# Patient Record
Sex: Male | Born: 1965 | Hispanic: Yes | Marital: Married | Smoking: Former smoker
Health system: Southern US, Community
[De-identification: ages and names within clinical notes are randomized; demographics above are authoritative.]

## PROBLEM LIST (undated history)

## (undated) DIAGNOSIS — K219 Gastro-esophageal reflux disease without esophagitis: Secondary | ICD-10-CM

## (undated) DIAGNOSIS — I10 Essential (primary) hypertension: Secondary | ICD-10-CM

## (undated) DIAGNOSIS — T7840XA Allergy, unspecified, initial encounter: Secondary | ICD-10-CM

## (undated) HISTORY — DX: Gastro-esophageal reflux disease without esophagitis: K21.9

## (undated) HISTORY — DX: Essential (primary) hypertension: I10

## (undated) HISTORY — PX: LEG SURGERY: SHX1003

## (undated) HISTORY — DX: Allergy, unspecified, initial encounter: T78.40XA

---

## 2008-10-23 ENCOUNTER — Encounter: Admission: RE | Admit: 2008-10-23 | Discharge: 2008-10-23 | Payer: Self-pay | Admitting: Family Medicine

## 2008-11-08 ENCOUNTER — Ambulatory Visit (HOSPITAL_BASED_OUTPATIENT_CLINIC_OR_DEPARTMENT_OTHER): Admission: RE | Admit: 2008-11-08 | Discharge: 2008-11-08 | Payer: Self-pay | Admitting: General Surgery

## 2008-11-08 HISTORY — PX: INGUINAL HERNIA REPAIR: SHX194

## 2009-11-22 ENCOUNTER — Ambulatory Visit (HOSPITAL_COMMUNITY): Admission: RE | Admit: 2009-11-22 | Discharge: 2009-11-22 | Payer: Self-pay | Admitting: Family Medicine

## 2010-11-19 LAB — POCT HEMOGLOBIN-HEMACUE: Hemoglobin: 17.2 g/dL — ABNORMAL HIGH (ref 13.0–17.0)

## 2010-11-20 LAB — BASIC METABOLIC PANEL
BUN: 10 mg/dL (ref 6–23)
CO2: 27 mEq/L (ref 19–32)
Calcium: 9.4 mg/dL (ref 8.4–10.5)
Chloride: 103 mEq/L (ref 96–112)
Creatinine, Ser: 0.79 mg/dL (ref 0.4–1.5)
GFR calc Af Amer: >60 mL/min (ref >60)
GFR calc non Af Amer: >60 mL/min (ref >60)
Glucose, Bld: 96 mg/dL (ref 70–99)
Potassium: 3.8 mEq/L (ref 3.5–5.1)
Sodium: 138 mEq/L (ref 135–145)

## 2010-11-20 LAB — URINALYSIS, ROUTINE W REFLEX MICROSCOPIC
Bilirubin Urine: NEGATIVE
Glucose, UA: NEGATIVE mg/dL
Hgb urine dipstick: NEGATIVE
Ketones, ur: NEGATIVE mg/dL
Nitrite: NEGATIVE
Protein, ur: NEGATIVE mg/dL
Specific Gravity, Urine: 1.019 (ref 1.005–1.030)
Urobilinogen, UA: 0.2 mg/dL (ref 0.0–1.0)
pH: 7 (ref 5.0–8.0)

## 2010-12-23 NOTE — Op Note (Signed)
NAMEHAMPTON, WIXOM                  ACCOUNT NO.:  [id number]   MEDICAL RECORD NO.:  [id number]          PATIENT TYPE:  AMB   LOCATION:  DSC                          FACILITY:  [facility]   PHYSICIAN:  Almond Lint, MD       DATE OF BIRTH:  [birth year]   DATE OF PROCEDURE:  11/08/2008  DATE OF DISCHARGE:                               OPERATIVE REPORT   PREOPERATIVE DIAGNOSIS:  Right inguinal hernia.   POSTOPERATIVE DIAGNOSIS:  Right inguinal hernia.   PROCEDURE:  Right inguinal hernia repair with mesh.   SURGEON:  Almond Lint, MD   ASSISTANT:  Currie Paris, MD   ANESTHESIA:  General and local.   FINDINGS:  Sliding indirect hernia with appendix completely adhered to  the entire sac.   SPECIMEN:  None.   ESTIMATED BLOOD LOSS:  Minimal.   COMPLICATIONS:  None known.   PROCEDURE:  Mr. Boley was identified in the holding area and taken to the  operating room where he was placed supine on the operating room table.  General endotracheal anesthesia was induced.  The abdomen was prepped  and draped in a sterile fashion.  The time-out was performed according  to the surgical safety checklist.  When all was correct, we continued.  The right groin was examined, and a 6-cm incision was made from just  lateral to the pubis toward the anterior superior iliac spine in an  oblique fashion.  The subcutaneous skin was divided with the Bovie  electrocautery.  The external oblique fascia was exposed.  The external  ring was found and an incision was made in the fascia with a #15 blade.  The fascia was elevated from the inguinal floor with the Metzenbaum  scissors and the external oblique was opened down to the external ring  anteromedially and superolaterally toward the ASIS.  A hemostat was  placed on the fascia and elevated and a Kitner was used to dissect  bluntly the underlying tissues from the undersurface of the external  oblique fascia.  The ilioinguinal nerve was identified on the  lower  border and dissected free sharply.  The superior border of the  iliohypogastric was identified and dissected free as far as possible.  The spermatic cord was elevated and a Penrose drain was passed around  it.  The hernia sac was identified along with a cord lipoma and this was  elevated off the spermatic cord taking care not to injure the vas  deferens or the testicular vessels.   Once the hernia sac was identified, it was attempted to be opened;  however, the appendix was completely adherent to the wall of the sac on  both sides.  It could not be reduced out of the sac.  The entire sac was  dunked back into the abdomen and reduced easily.  A stitch was placed in  the inguinal floor to reinforce this.  The mesh was then cut to the  appropriate size and secured to Cooper's ligament.  The inferior  shelving edge was run with inferior border of the mesh with 2-0 Prolene.  The tails were cut in the mesh and passed around the spermatic cord.  The superior border of the mesh was then secured to the muscular fascia.  The tails were overlapped loosely around the lateral most aspect of the  cord and were secured with additional interrupted 2-0 Prolene.  The  tails were tucked underneath the external oblique fascia.  The external  oblique was then closed overlying the mesh with 2-0 Vicryl in a running  fashion.  The Scarpa fascia was closed using a running 3-0 Vicryl.  The  skin was reapproximated using 3-0 deep dermal Vicryl in an interrupted  fashion and a 4-0 Monocryl in a running subcuticular fashion.  The skin  was then clean, dry, and dressed with Dermabond.  The patient was  awakened from anesthesia and taken to PACU in stable condition.      Almond Lint, MD  Electronically Signed     FB/MEDQ  D:  11/08/2008  T:  11/09/2008  Job:  [id number]

## 2013-01-17 ENCOUNTER — Encounter (INDEPENDENT_AMBULATORY_CARE_PROVIDER_SITE_OTHER): Payer: Self-pay | Admitting: Surgery

## 2013-01-17 ENCOUNTER — Ambulatory Visit (INDEPENDENT_AMBULATORY_CARE_PROVIDER_SITE_OTHER): Payer: PRIVATE HEALTH INSURANCE | Admitting: Surgery

## 2013-01-17 VITALS — BP 158/90 | HR 64 | Temp 97.8°F | Resp 16 | Ht 71.0 in | Wt 183.0 lb

## 2013-01-17 DIAGNOSIS — K8010 Calculus of gallbladder with chronic cholecystitis without obstruction: Secondary | ICD-10-CM | POA: Insufficient documentation

## 2013-01-17 DIAGNOSIS — I10 Essential (primary) hypertension: Secondary | ICD-10-CM | POA: Insufficient documentation

## 2013-01-17 NOTE — Progress Notes (Signed)
Subjective:     Patient ID: Taylor Terry, male   DOB: 1966-04-20, 47 y.o.   MRN: [id number]  HPI  Taylor Terry  [birth year] [id number]  Patient Care Team: Alois Cliche as PCP - General (Physician Assistant)  This patient is a 47 y.o.male who presents today for surgical evaluation at the request of Dr. Lerry Liner.   Reason for visit: Abdominal pain and gallstones  Pleasant Hispanic gentleman.  He comes today with his son.  Speaks English pretty well but external and the son for clarification.  He has been struggling with intermittent abdominal pains for over five years.  Has become more intense and constant now.  He points to his right subcostal ridge.  It tends to be worse in the morning when he is more active.  He does machine work with moderate activity.  The attacks tender happened in the morning.  We will get bloated and nauseated.  Feel like he will vomit.  He has mild reflux for which over-the-counter Zantac controls.  This is not like that.  Can walk several miles without difficulty.  No history of coronary issues.  No history of hepatitis or pancreatitis.  Claims to drink three beers on the weekends.  No fall or trauma.  No personal nor family history of GI/colon cancer, inflammatory bowel disease, irritable bowel syndrome, allergy such as Celiac Sprue, dietary/dairy problems, colitis, ulcers nor gastritis.  No recent sick contacts/gastroenteritis.  No travel outside the country.  No changes in diet.    There are no active problems to display for this patient.   Past Medical History  Diagnosis Date  . Hypertension   . Allergy     Past Surgical History  Procedure Laterality Date  . Hernia repair Right 11/08/2008    [facility], Dr. Donell Beers  . Leg surgery      left    History   Social History  . Marital Status: Married    Spouse Name: N/A    Number of Children: N/A  . Years of Education: N/A   Occupational History  . Not on file.   Social History Main Topics  . Smoking  status: Never Smoker   . Smokeless tobacco: Never Used  . Alcohol Use: Yes     Comment: rare  . Drug Use: No  . Sexually Active: Not on file   Other Topics Concern  . Not on file   Social History Narrative  . No narrative on file    History reviewed. No pertinent family history.  Current Outpatient Prescriptions  Medication Sig Dispense Refill  . lisinopril (PRINIVIL,ZESTRIL) 10 MG tablet       . loratadine (CLARITIN) 10 MG tablet Take 10 mg by mouth daily.      . Multiple Vitamin (MULTIVITAMIN) capsule Take 1 capsule by mouth daily.       No current facility-administered medications for this visit.     No Known Allergies  BP 158/90  Pulse 64  Temp(Src) 97.8 F (36.6 C) (Temporal)  Resp 16  Ht 5\' 11"  (1.803 m)  Wt 183 lb (83.008 kg)  BMI 25.53 kg/m2  No results found.   Review of Systems  Constitutional: Negative for fever, chills and diaphoresis.  HENT: Negative for nosebleeds, sore throat, facial swelling, mouth sores, trouble swallowing and ear discharge.   Eyes: Negative for photophobia, discharge and visual disturbance.  Respiratory: Negative for choking, chest tightness, shortness of breath and stridor.   Cardiovascular: Negative for chest pain and palpitations.  Gastrointestinal: Positive  for nausea and abdominal pain. Negative for vomiting, diarrhea, constipation, blood in stool, abdominal distention, anal bleeding and rectal pain.  Endocrine: Negative for cold intolerance and heat intolerance.  Genitourinary: Negative for dysuria, urgency, frequency, flank pain, difficulty urinating and testicular pain.  Musculoskeletal: Negative for myalgias, back pain, arthralgias and gait problem.  Skin: Negative for color change, pallor, rash and wound.  Allergic/Immunologic: Negative for environmental allergies and food allergies.  Neurological: Negative for dizziness, speech difficulty, weakness, numbness and headaches.  Hematological: Negative for adenopathy. Does  not bruise/bleed easily.  Psychiatric/Behavioral: Negative for hallucinations, confusion and agitation.       Objective:   Physical Exam  Constitutional: He is oriented to person, place, and time. He appears well-developed and well-nourished. No distress.  HENT:  Head: Normocephalic.  Mouth/Throat: Oropharynx is clear and moist. No oropharyngeal exudate.  Eyes: Conjunctivae and EOM are normal. Pupils are equal, round, and reactive to light. No scleral icterus.  Neck: Normal range of motion. Neck supple. No tracheal deviation present.  Cardiovascular: Normal rate, regular rhythm and intact distal pulses.   Pulmonary/Chest: Effort normal and breath sounds normal. No respiratory distress.  Abdominal: Soft. He exhibits no distension. There is tenderness. There is no rigidity, no rebound, no guarding, no CVA tenderness and no tenderness at McBurney's point. No hernia. Hernia confirmed negative in the right inguinal area and confirmed negative in the left inguinal area.    Musculoskeletal: Normal range of motion. He exhibits no tenderness.  Lymphadenopathy:    He has no cervical adenopathy.       Right: No inguinal adenopathy present.       Left: No inguinal adenopathy present.  Neurological: He is alert and oriented to person, place, and time. No cranial nerve deficit. He exhibits normal muscle tone. Coordination normal.  Skin: Skin is warm and dry. No rash noted. He is not diaphoretic. No erythema. No pallor.  Psychiatric: He has a normal mood and affect. His behavior is normal. Judgment and thought content normal.   Ultrasound in 2010 shows normal gallbladder without stones.  Repeated ultrasound done at outside facility this year notes gallstones.  Labs attached.  Normal liver function tests, amylase, lipase    Assessment:     Right upper quadrant pain with nausea and bloating and gallstones.  Rest of differential diagnosis unlikely.     Plan:     Given the chronic history and  resolution differential diagnosis unlikely, I suspect gallstones are the etiology of his symptoms.  I offered him see a gastroenterologist to do upper and lower endoscopy to rule that out.  However, with mild controlled heartburn and no irregular bowel habits to suspect a colon Issue, nor any family history; I am doubtful they will find anything.  I would proceed with surgery first.  He and his son agree.  I did caution him this may not solve the problem but I am hopeful.  They agree.  The anatomy & physiology of hepatobiliary & pancreatic function was discussed.  The pathophysiology of gallbladder dysfunction was discussed.  Natural history risks without surgery was discussed.   I feel the risks of no intervention will lead to serious problems that outweigh the operative risks; therefore, I recommended cholecystectomy to remove the pathology.  I explained laparoscopic techniques with possible need for an open approach.  Probable cholangiogram to evaluate the bilary tract was explained as well.    Risks such as bleeding, infection, abscess, leak, injury to other organs, need for further treatment,  heart attack, death, and other risks were discussed.  I noted a good likelihood this will help address the problem.  Possibility that this will not correct all abdominal symptoms was explained.  Goals of post-operative recovery were discussed as well.  We will work to minimize complications.  An educational handout further explaining the pathology and treatment options was given as well.  Questions were answered.  The patient expresses understanding & wishes to proceed with surgery.

## 2013-01-17 NOTE — Patient Instructions (Addendum)
See the Handout(s) we gave you.  Consider surgery.  Please call our office at [phone] if you wish to schedule surgery or if you have further questions / concerns.   Colelitiasis (Cholelithiasis)  La colelitiasis (tambin llamada clculos en la vescula) es una enfermedad en la que se forman piedras en la vescula. La vescula es un rgano no esencial que almacena la bilis que se forma en el hgado y que ayuda a Engineer, agricultural. Los clculos comienzan como pequeos cristales y lentamente se transforman en piedras. El dolor aparece cuando los clculos producen espasmos y obstruyen el conducto. El dolor tambin se produce cuando una piedra sale por el conducto.  Las mujeres tienen ms probabilidades de Astronomer. Otros factores que aumentan el riesgo de formar clculos en la vescula son:   Lavonia Drafts mltiples. Algunas veces los mdicos aconsejan extirpar los clculos biliares antes de futuros embarazos.  Obesidad.  Dietas que incluyan comidas fritas y grasas.  El paso de los aos (tener ms de 60 aos).  El uso prolongado de medicamentos que contengan hormonas femeninas.  Diabetes mellitus.  Rpida prdida de peso.  Historia familiar de clculos (herencia). SNTOMAS   Ganas de vomitar (nuseas ).  Dolor abdominal.  Piel amarilla (ictericia)  Dolor sbito. Puede persistir durante algunos minutos hasta algunas horas.  El dolor empeora al respirar profundamente o en situaciones de tensin.  Grant Ruts.  Sensibilidad al tacto. En algunos casos, cuando los clculos biliares no se mueven hacia el conducto biliar, las personas no sienten dolor ni presentan sntomas. Estos se denominan clculos "silenciosos".  TRATAMIENTO  En los Rennert graves, podr ser [place].  INSTRUCCIONES PARA EL CUIDADO DOMICILIARIO  Utilice los medicamentos de venta libre o de prescripcin para Chief Technology Officer, el malestar o la Diller, segn se lo indique  el profesional que lo asiste.  Siga una dieta baja en grasas hasta que el mdico lo vea nuevamente. (Las grasas hacen que la vescula se Technical sales engineer, lo que puede Engineer, agricultural).  Concurra al control como se le indic. Los ataques casi siempre son recurrentes y generalmente habr que someterse a una ciruga como [place]. SOLICITE ATENCIN MDICA DE INMEDIATO SI:  El dolor aumenta y no puede controlarlo con los medicamentos.  La temperatura oral se eleva sin motivo por encima de 102 F (38.9 C) y no puede controlarla con los medicamentos.  Presenta nuseas o vmitos. EST SEGURO QUE:   Comprende las instrucciones para el alta mdica.  Controlar su enfermedad.  Solicitar atencin mdica de inmediato segn las indicaciones. Document Released: 05/13/2006 Document Revised: 10/19/2011 [facility] Patient Information 2014 [place], [place].  LAPAROSCOPIC SURGERY: POST OP INSTRUCTIONS  1. DIET: Follow a light bland diet the first 24 hours after arrival home, such as soup, liquids, crackers, etc.  Be sure to include lots of fluids daily.  Avoid fast food or heavy meals as your are more likely to get nauseated.  Eat a low fat the next few days after surgery.   2. Take your usually prescribed home medications unless otherwise directed. 3. PAIN CONTROL: a. Pain is best controlled by a usual combination of three different methods TOGETHER: i. Ice/Heat ii. Over the counter pain medication iii. Prescription pain medication b. Most patients will experience some swelling and bruising around the incisions.  Ice packs or heating pads (30-60 minutes up to 6 times a day) will help. Use ice for the first few days to help decrease swelling and bruising, then  switch to heat to help relax tight/sore spots and speed recovery.  Some people prefer to use ice alone, heat alone, alternating between ice & heat.  Experiment to what works for you.  Swelling and bruising can take several weeks to resolve.    c. It is helpful to take an over-the-counter pain medication regularly for the first few weeks.  Choose one of the following that works best for you: i. Naproxen (Aleve, etc)  Two 220mg  tabs twice a day ii. Ibuprofen (Advil, etc) Three 200mg  tabs four times a day (every meal & bedtime) iii. Acetaminophen (Tylenol, etc) 500-650mg  four times a day (every meal & bedtime) d. A  prescription for pain medication (such as oxycodone, hydrocodone, etc) should be given to you upon discharge.  Take your pain medication as prescribed.  i. If you are having problems/concerns with the prescription medicine (does not control pain, nausea, vomiting, rash, itching, etc), please call us [phone] to see if we need to switch you to a different pain medicine that will work better for you and/or control your side effect better. ii. If you need a refill on your pain medication, please contact your pharmacy.  They will contact our office to request authorization. Prescriptions will not be filled after 5 pm or on week-ends. 4. Avoid getting constipated.  Between the surgery and the pain medications, it is common to experience some constipation.  Increasing fluid intake and taking a fiber supplement (such as Metamucil, Citrucel, FiberCon, MiraLax, etc) 1-2 times a day regularly will usually help prevent this problem from occurring.  A mild laxative (prune juice, Milk of Magnesia, MiraLax, etc) should be taken according to package directions if there are no bowel movements after 48 hours.   5. Watch out for diarrhea.  If you have many loose bowel movements, simplify your diet to bland foods & liquids for a few days.  Stop any stool softeners and decrease your fiber supplement.  Switching to mild anti-diarrheal medications (Kayopectate, Pepto Bismol) can help.  If this worsens or does not improve, please call us. 6. Wash / shower every day.  You may shower over the dressings as they are waterproof.  Continue to shower over  incision(s) after the dressing is off. 7. Remove your waterproof bandages 5 days after surgery.  You may leave the incision open to air.  You may replace a dressing/Band-Aid to cover the incision for comfort if you wish.  8. ACTIVITIES as tolerated:   a. You may resume regular (light) daily activities beginning the next day-such as daily self-care, walking, climbing stairs-gradually increasing activities as tolerated.  If you can walk 30 minutes without difficulty, it is safe to try more intense activity such as jogging, treadmill, bicycling, low-impact aerobics, swimming, etc. b. Save the most intensive and strenuous activity for last such as sit-ups, heavy lifting, contact sports, etc  Refrain from any heavy lifting or straining until you are off narcotics for pain control.   c. DO NOT PUSH THROUGH PAIN.  Let pain be your guide: If it hurts to do something, don't do it.  Pain is your body warning you to avoid that activity for another week until the pain goes down. d. You may drive when you are no longer taking prescription pain medication, you can comfortably wear a seatbelt, and you can safely maneuver your car and apply brakes. e. Bonita Quin may have sexual intercourse when it is comfortable.  9. FOLLOW UP in our office a. Please call [facility] at [phone] to set up an appointment to see your surgeon in the office for a follow-up appointment approximately 2-3 weeks after your surgery. b. Make sure that you call for this appointment the day you arrive home to insure a convenient appointment time. 10. IF YOU HAVE DISABILITY OR FAMILY LEAVE FORMS, BRING THEM TO THE OFFICE FOR PROCESSING.  DO NOT GIVE THEM TO YOUR DOCTOR.   WHEN TO CALL us [phone]: 1. Poor pain control 2. Reactions / problems with new medications (rash/itching, nausea, etc)  3. Fever over 101.5 F (38.5 C) 4. Inability to urinate 5. Nausea and/or vomiting 6. Worsening swelling or bruising 7. Continued bleeding from  incision. 8. Increased pain, redness, or drainage from the incision   The clinic staff is available to answer your questions during regular business hours (8:30am-5pm).  Please don't hesitate to call and ask to speak to one of our nurses for clinical concerns.   If you have a medical emergency, go to the nearest emergency room or call 911.  A surgeon from [facility] is always on call at the [facility], [place] [address], [address], [place], [place]  [address] ? MAIN: [phone] ? TOLL FREE: [phone] ?  FAX [phone] [email or web address]

## 2013-01-23 ENCOUNTER — Encounter (HOSPITAL_COMMUNITY)
Admission: RE | Admit: 2013-01-23 | Discharge: 2013-01-23 | Disposition: A | Discharge status: Home / Self Care | Payer: PRIVATE HEALTH INSURANCE | Source: Ambulatory Visit | Attending: Surgery | Admitting: Surgery

## 2013-01-23 ENCOUNTER — Ambulatory Visit (HOSPITAL_COMMUNITY)
Admission: RE | Admit: 2013-01-23 | Discharge: 2013-01-23 | Disposition: A | Discharge status: Home / Self Care | Payer: PRIVATE HEALTH INSURANCE | Source: Ambulatory Visit | Attending: Surgery | Admitting: Surgery

## 2013-01-23 ENCOUNTER — Encounter (HOSPITAL_COMMUNITY): Payer: Self-pay | Admitting: Pharmacy Technician

## 2013-01-23 ENCOUNTER — Encounter (HOSPITAL_COMMUNITY): Payer: Self-pay

## 2013-01-23 DIAGNOSIS — Z01818 Encounter for other preprocedural examination: Secondary | ICD-10-CM | POA: Insufficient documentation

## 2013-01-23 DIAGNOSIS — Z01812 Encounter for preprocedural laboratory examination: Secondary | ICD-10-CM | POA: Insufficient documentation

## 2013-01-23 DIAGNOSIS — I10 Essential (primary) hypertension: Secondary | ICD-10-CM | POA: Insufficient documentation

## 2013-01-23 DIAGNOSIS — Z01810 Encounter for preprocedural cardiovascular examination: Secondary | ICD-10-CM | POA: Insufficient documentation

## 2013-01-23 LAB — CBC
HCT: 41.9 % (ref 39.0–52.0)
Hemoglobin: 14.7 g/dL (ref 13.0–17.0)
MCH: 29.8 pg (ref 26.0–34.0)
MCHC: 35.1 g/dL (ref 30.0–36.0)
MCV: 84.8 fL (ref 78.0–100.0)
Platelets: 277 10*3/uL (ref 150–400)
RBC: 4.94 MIL/uL (ref 4.22–5.81)
RDW: 12.2 % (ref 11.5–15.5)
WBC: 4.8 10*3/uL (ref 4.0–10.5)

## 2013-01-23 LAB — SURGICAL PCR SCREEN
MRSA, PCR: NEGATIVE
Staphylococcus aureus: NEGATIVE

## 2013-01-23 LAB — BASIC METABOLIC PANEL
BUN: 16 mg/dL (ref 6–23)
CO2: 30 mEq/L (ref 19–32)
Calcium: 9.5 mg/dL (ref 8.4–10.5)
Chloride: 104 mEq/L (ref 96–112)
Creatinine, Ser: 0.82 mg/dL (ref 0.50–1.35)
GFR calc Af Amer: >90 mL/min (ref >90)
GFR calc non Af Amer: >90 mL/min (ref >90)
Glucose, Bld: 91 mg/dL (ref 70–99)
Potassium: 3.9 mEq/L (ref 3.5–5.1)
Sodium: 141 mEq/L (ref 135–145)

## 2013-01-23 NOTE — Patient Instructions (Addendum)
20 Marwin Primmer  01/23/2013   Your procedure is scheduled on:  01/26/13  THURSDAY  Report to [facility] at 0800      AM.  Call this number if you have problems the morning of surgery: [phone]       Remember:   Do not eat food  Or drink :After Midnight. Wednesday NIGHT   Take these medicines the morning of surgery with A SIP OF WATER:  PROLISEC   .  Contacts, dentures or partial plates can not be worn to surgery  Leave suitcase in the car. After surgery it may be brought to your room.  For patients admitted to the hospital, checkout time is 11:00 AM day of  discharge.             SPECIAL INSTRUCTIONS- SEE [place] PREPARING FOR SURGERY INSTRUCTION SHEET-     DO NOT WEAR JEWELRY, LOTIONS, POWDERS, OR PERFUMES.  WOMEN-- DO NOT SHAVE LEGS OR UNDERARMS FOR 12 HOURS BEFORE SHOWERS. MEN MAY SHAVE FACE.  Patients discharged the day of surgery will not be allowed to drive home. IF going home the day of surgery, you must have a driver and someone to stay with you for the first 24 hours  Name and phone number of your driver:    Son  Or wife                                                                    Please read over the following fact sheets that you were given: MRSA Information                                                                                  Fenix Rorke  PST [phone]  [phone]                 FAILURE TO FOLLOW THESE INSTRUCTIONS MAY RESULT IN  CANCELLATION   OF YOUR SURGERY                                                  Patient Signature _____________________________

## 2013-01-23 NOTE — Progress Notes (Signed)
Nephew  Junita Push present for interpretation and liability release signed by patient and placed on chart

## 2013-01-26 ENCOUNTER — Ambulatory Visit (HOSPITAL_COMMUNITY)
Admission: RE | Admit: 2013-01-26 | Discharge: 2013-01-26 | Disposition: A | Discharge status: Home / Self Care | Payer: PRIVATE HEALTH INSURANCE | Source: Ambulatory Visit | Attending: Surgery | Admitting: Surgery

## 2013-01-26 ENCOUNTER — Encounter (HOSPITAL_COMMUNITY): Payer: Self-pay | Admitting: Anesthesiology

## 2013-01-26 ENCOUNTER — Encounter (HOSPITAL_COMMUNITY)
Admission: RE | Disposition: A | Discharge status: Home / Self Care | Payer: Self-pay | Source: Ambulatory Visit | Attending: Surgery

## 2013-01-26 ENCOUNTER — Ambulatory Visit (HOSPITAL_COMMUNITY): Payer: PRIVATE HEALTH INSURANCE

## 2013-01-26 ENCOUNTER — Encounter (HOSPITAL_COMMUNITY): Payer: Self-pay | Admitting: *Deleted

## 2013-01-26 ENCOUNTER — Ambulatory Visit (HOSPITAL_COMMUNITY): Payer: PRIVATE HEALTH INSURANCE | Admitting: Anesthesiology

## 2013-01-26 DIAGNOSIS — K8010 Calculus of gallbladder with chronic cholecystitis without obstruction: Secondary | ICD-10-CM

## 2013-01-26 DIAGNOSIS — K219 Gastro-esophageal reflux disease without esophagitis: Secondary | ICD-10-CM | POA: Insufficient documentation

## 2013-01-26 DIAGNOSIS — Z79899 Other long term (current) drug therapy: Secondary | ICD-10-CM | POA: Insufficient documentation

## 2013-01-26 DIAGNOSIS — K824 Cholesterolosis of gallbladder: Secondary | ICD-10-CM

## 2013-01-26 DIAGNOSIS — I10 Essential (primary) hypertension: Secondary | ICD-10-CM | POA: Insufficient documentation

## 2013-01-26 HISTORY — PX: CHOLECYSTECTOMY: SHX55

## 2013-01-26 SURGERY — LAPAROSCOPIC CHOLECYSTECTOMY WITH INTRAOPERATIVE CHOLANGIOGRAM
Anesthesia: General | Site: Abdomen | Wound class: Clean Contaminated

## 2013-01-26 MED ORDER — 0.9 % SODIUM CHLORIDE (POUR BTL) OPTIME
TOPICAL | Status: DC | PRN
  Administered 2013-01-26: 1000 mL

## 2013-01-26 MED ORDER — MEPERIDINE HCL 50 MG/ML IJ SOLN
INTRAMUSCULAR | Status: AC
  Filled 2013-01-26: qty 1

## 2013-01-26 MED ORDER — ONDANSETRON HCL 4 MG/2ML IJ SOLN: 4.0000 mg | Freq: Four times a day (QID) | INTRAMUSCULAR | Status: DC | PRN

## 2013-01-26 MED ORDER — IOHEXOL 300 MG/ML  SOLN
INTRAMUSCULAR | Status: DC | PRN
  Administered 2013-01-26: 10 mL via EPIDURAL

## 2013-01-26 MED ORDER — ACETAMINOPHEN 10 MG/ML IV SOLN: 1000.0000 mg | Freq: Once | INTRAVENOUS | Status: DC | PRN

## 2013-01-26 MED ORDER — HYDROMORPHONE HCL PF 1 MG/ML IJ SOLN: 0.2500 mg | INTRAMUSCULAR | Status: DC | PRN

## 2013-01-26 MED ORDER — OXYCODONE HCL 5 MG/5ML PO SOLN
5.0000 mg | Freq: Once | ORAL | Status: DC | PRN
  Filled 2013-01-26: qty 5

## 2013-01-26 MED ORDER — SUCCINYLCHOLINE CHLORIDE 20 MG/ML IJ SOLN
INTRAMUSCULAR | Status: DC | PRN
  Administered 2013-01-26: 100 mg via INTRAVENOUS

## 2013-01-26 MED ORDER — ACETAMINOPHEN 650 MG RE SUPP
650.0000 mg | RECTAL | Status: DC | PRN
  Filled 2013-01-26: qty 1

## 2013-01-26 MED ORDER — SUFENTANIL CITRATE 50 MCG/ML IV SOLN
INTRAVENOUS | Status: DC | PRN
  Administered 2013-01-26: 10 ug via INTRAVENOUS
  Administered 2013-01-26: 20 ug via INTRAVENOUS
  Administered 2013-01-26: 10 ug via INTRAVENOUS

## 2013-01-26 MED ORDER — LACTATED RINGERS IR SOLN
Status: DC | PRN
  Administered 2013-01-26: 1000 mL

## 2013-01-26 MED ORDER — LACTATED RINGERS IV SOLN
INTRAVENOUS | Status: DC
  Administered 2013-01-26: 1000 mL via INTRAVENOUS
  Administered 2013-01-26: 12:00:00 via INTRAVENOUS

## 2013-01-26 MED ORDER — ONDANSETRON HCL 4 MG/2ML IJ SOLN
INTRAMUSCULAR | Status: DC | PRN
  Administered 2013-01-26: 4 mg via INTRAVENOUS

## 2013-01-26 MED ORDER — MIDAZOLAM HCL 5 MG/5ML IJ SOLN
INTRAMUSCULAR | Status: DC | PRN
  Administered 2013-01-26: 2 mg via INTRAVENOUS

## 2013-01-26 MED ORDER — SODIUM CHLORIDE 0.9 % IJ SOLN: 3.0000 mL | Freq: Two times a day (BID) | INTRAMUSCULAR | Status: DC

## 2013-01-26 MED ORDER — PROPOFOL 10 MG/ML IV BOLUS
INTRAVENOUS | Status: DC | PRN
  Administered 2013-01-26: 170 mg via INTRAVENOUS

## 2013-01-26 MED ORDER — OXYCODONE HCL 5 MG PO TABS: 5.0000 mg | ORAL_TABLET | ORAL | Status: DC | PRN

## 2013-01-26 MED ORDER — ACETAMINOPHEN 325 MG PO TABS: 650.0000 mg | ORAL_TABLET | ORAL | Status: DC | PRN

## 2013-01-26 MED ORDER — IOHEXOL 300 MG/ML  SOLN
INTRAMUSCULAR | Status: AC
  Filled 2013-01-26: qty 1

## 2013-01-26 MED ORDER — CISATRACURIUM BESYLATE (PF) 10 MG/5ML IV SOLN
INTRAVENOUS | Status: DC | PRN
  Administered 2013-01-26: 6 mg via INTRAVENOUS
  Administered 2013-01-26: 2 mg via INTRAVENOUS

## 2013-01-26 MED ORDER — STERILE WATER FOR IRRIGATION IR SOLN
Status: DC | PRN
  Administered 2013-01-26: 1500 mL

## 2013-01-26 MED ORDER — BUPIVACAINE-EPINEPHRINE 0.25% -1:200000 IJ SOLN
INTRAMUSCULAR | Status: AC
  Filled 2013-01-26: qty 1

## 2013-01-26 MED ORDER — SODIUM CHLORIDE 0.9 % IJ SOLN: 3.0000 mL | INTRAMUSCULAR | Status: DC | PRN

## 2013-01-26 MED ORDER — LIDOCAINE HCL (CARDIAC) 20 MG/ML IV SOLN
INTRAVENOUS | Status: DC | PRN
  Administered 2013-01-26: 100 mg via INTRAVENOUS

## 2013-01-26 MED ORDER — GLYCOPYRROLATE 0.2 MG/ML IJ SOLN
INTRAMUSCULAR | Status: DC | PRN
  Administered 2013-01-26: .6 mg via INTRAVENOUS

## 2013-01-26 MED ORDER — DEXAMETHASONE SODIUM PHOSPHATE 10 MG/ML IJ SOLN
INTRAMUSCULAR | Status: DC | PRN
  Administered 2013-01-26: 10 mg via INTRAVENOUS

## 2013-01-26 MED ORDER — KETOROLAC TROMETHAMINE 30 MG/ML IJ SOLN
INTRAMUSCULAR | Status: DC | PRN
  Administered 2013-01-26: 30 mg via INTRAVENOUS

## 2013-01-26 MED ORDER — BUPIVACAINE-EPINEPHRINE 0.25% -1:200000 IJ SOLN
INTRAMUSCULAR | Status: DC | PRN
  Administered 2013-01-26: 50 mL

## 2013-01-26 MED ORDER — SODIUM CHLORIDE 0.9 % IV SOLN: 250.0000 mL | INTRAVENOUS | Status: DC | PRN

## 2013-01-26 MED ORDER — PROMETHAZINE HCL 25 MG/ML IJ SOLN: 6.2500 mg | INTRAMUSCULAR | Status: DC | PRN

## 2013-01-26 MED ORDER — NEOSTIGMINE METHYLSULFATE 1 MG/ML IJ SOLN
INTRAMUSCULAR | Status: DC | PRN
  Administered 2013-01-26: 4 mg via INTRAVENOUS

## 2013-01-26 MED ORDER — MEPERIDINE HCL 50 MG/ML IJ SOLN
6.2500 mg | INTRAMUSCULAR | Status: DC | PRN
  Administered 2013-01-26: 12.5 mg via INTRAVENOUS

## 2013-01-26 MED ORDER — OXYCODONE HCL 5 MG PO TABS: 5.0000 mg | ORAL_TABLET | Freq: Once | ORAL | Status: DC | PRN

## 2013-01-26 SURGICAL SUPPLY — 48 items
APPLIER CLIP ROT 10 11.4 M/L (STAPLE)
APPLIER CLIP UNV 5X34 EPIX (ENDOMECHANICALS) ×3 IMPLANT
APR CLP MED LRG 11.4X10 (STAPLE)
APR XCLPCLP 20M/L UNV 34X5 (ENDOMECHANICALS) ×2
BAG SPEC RTRVL LRG 6X4 10 (ENDOMECHANICALS)
CABLE HIGH FREQUENCY MONO STRZ (ELECTRODE) ×3 IMPLANT
CANISTER SUCTION 2500CC (MISCELLANEOUS) ×3 IMPLANT
CLIP APPLIE ROT 10 11.4 M/L (STAPLE) IMPLANT
CLOTH BEACON ORANGE TIMEOUT ST (SAFETY) ×3 IMPLANT
COVER MAYO STAND STRL (DRAPES) ×3 IMPLANT
DECANTER SPIKE VIAL GLASS SM (MISCELLANEOUS) ×3 IMPLANT
DRAIN CHANNEL 19F RND (DRAIN) IMPLANT
DRAPE C-ARM 42X120 X-RAY (DRAPES) ×3 IMPLANT
DRAPE LAPAROSCOPIC ABDOMINAL (DRAPES) ×3 IMPLANT
DRAPE WARM FLUID 44X44 (DRAPE) ×3 IMPLANT
DRSG TEGADERM 4X4.75 (GAUZE/BANDAGES/DRESSINGS) ×3 IMPLANT
ELECT REM PT RETURN 9FT ADLT (ELECTROSURGICAL) ×3
ELECTRODE REM PT RTRN 9FT ADLT (ELECTROSURGICAL) ×2 IMPLANT
EVACUATOR SILICONE 100CC (DRAIN) IMPLANT
GAUZE SPONGE 2X2 8PLY STRL LF (GAUZE/BANDAGES/DRESSINGS) ×2 IMPLANT
GLOVE BIOGEL PI IND STRL 7.0 (GLOVE) ×2 IMPLANT
GLOVE BIOGEL PI INDICATOR 7.0 (GLOVE) ×1
GLOVE ECLIPSE 8.0 STRL XLNG CF (GLOVE) ×3 IMPLANT
GLOVE INDICATOR 8.0 STRL GRN (GLOVE) ×3 IMPLANT
GOWN STRL NON-REIN LRG LVL3 (GOWN DISPOSABLE) IMPLANT
GOWN STRL REIN XL XLG (GOWN DISPOSABLE) ×6 IMPLANT
IV LACTATED RINGER IRRG 3000ML (IV SOLUTION) ×3
IV LR IRRIG 3000ML ARTHROMATIC (IV SOLUTION) ×2 IMPLANT
KIT BASIN OR (CUSTOM PROCEDURE TRAY) ×3 IMPLANT
NS IRRIG 1000ML POUR BTL (IV SOLUTION) ×3 IMPLANT
POUCH SPECIMEN RETRIEVAL 10MM (ENDOMECHANICALS) IMPLANT
SCALPEL HARMONIC ACE (MISCELLANEOUS) ×3 IMPLANT
SCISSORS LAP 5X35 DISP (ENDOMECHANICALS) ×2 IMPLANT
SET CHOLANGIOGRAPH MIX (MISCELLANEOUS) ×3 IMPLANT
SET IRRIG TUBING LAPAROSCOPIC (IRRIGATION / IRRIGATOR) ×3 IMPLANT
SOLUTION ANTI FOG 6CC (MISCELLANEOUS) IMPLANT
SPONGE GAUZE 2X2 STER 10/PKG (GAUZE/BANDAGES/DRESSINGS) ×1
STRIP CLOSURE SKIN 1/2X4 (GAUZE/BANDAGES/DRESSINGS) IMPLANT
SUT MNCRL AB 4-0 PS2 18 (SUTURE) ×3 IMPLANT
SUT PDS AB 0 CT1 36 (SUTURE) ×2 IMPLANT
SUT VICRYL 0 TIES 12 18 (SUTURE) IMPLANT
TOWEL OR 17X26 10 PK STRL BLUE (TOWEL DISPOSABLE) ×3 IMPLANT
TRAY LAP CHOLE (CUSTOM PROCEDURE TRAY) ×3 IMPLANT
TROCAR 5M 150ML BLDLS (TROCAR) ×3 IMPLANT
TROCAR BLADELESS OPT 5 100 (ENDOMECHANICALS) ×2 IMPLANT
TROCAR BLADELESS OPT 5 150 (ENDOMECHANICALS) ×2 IMPLANT
TROCAR XCEL NON-BLD 5MMX100MML (ENDOMECHANICALS) ×3 IMPLANT
TUBING INSUFFLATION 10FT LAP (TUBING) ×3 IMPLANT

## 2013-01-26 NOTE — Interval H&P Note (Signed)
History and Physical Interval Note:  01/26/2013 10:09 AM  Taylor Terry  has presented today for surgery, with the diagnosis of sympotomatic billary colic, probable chronic cholecystitis  The various methods of treatment have been discussed with the patient and family. After consideration of risks, benefits and other options for treatment, the patient has consented to  Procedure(s): LAPAROSCOPIC CHOLECYSTECTOMY SINGLE PORT (N/A) as a surgical intervention .  The patient's history has been reviewed, patient examined, no change in status, stable for surgery.  I have reviewed the patient's chart and labs.  Questions were answered to the patient's satisfaction.     Vaibhav Fogleman C.

## 2013-01-26 NOTE — Preoperative (Signed)
Beta Blockers   Reason not to administer Beta Blockers:Not Applicable 

## 2013-01-26 NOTE — H&P (View-Only) (Signed)
Subjective:     Patient ID: Taylor Terry, male   DOB: 11/14/1965, 47 y.o.   MRN: [id number]  HPI  Taylor Terry  12/12/1965 [id number]  Patient Care Team: Tracey Aguilar as PCP - General (Physician Assistant)  This patient is a 47 y.o.male who presents today for surgical evaluation at the request of Dr. Dwight Williams.   Reason for visit: Abdominal pain and gallstones  Pleasant Hispanic gentleman.  He comes today with his son.  Speaks English pretty well but external and the son for clarification.  He has been struggling with intermittent abdominal pains for over five years.  Has become more intense and constant now.  He points to his right subcostal ridge.  It tends to be worse in the morning when he is more active.  He does machine work with moderate activity.  The attacks tender happened in the morning.  We will get bloated and nauseated.  Feel like he will vomit.  He has mild reflux for which over-the-counter Zantac controls.  This is not like that.  Can walk several miles without difficulty.  No history of coronary issues.  No history of hepatitis or pancreatitis.  Claims to drink three beers on the weekends.  No fall or trauma.  No personal nor family history of GI/colon cancer, inflammatory bowel disease, irritable bowel syndrome, allergy such as Celiac Sprue, dietary/dairy problems, colitis, ulcers nor gastritis.  No recent sick contacts/gastroenteritis.  No travel outside the country.  No changes in diet.    There are no active problems to display for this patient.   Past Medical History  Diagnosis Date  . Hypertension   . Allergy     Past Surgical History  Procedure Laterality Date  . Hernia repair Right 11/08/2008    [facility], Dr. Byerly  . Leg surgery      left    History   Social History  . Marital Status: Married    Spouse Name: N/A    Number of Children: N/A  . Years of Education: N/A   Occupational History  . Not on file.   Social History Main Topics  . Smoking  status: Never Smoker   . Smokeless tobacco: Never Used  . Alcohol Use: Yes     Comment: rare  . Drug Use: No  . Sexually Active: Not on file   Other Topics Concern  . Not on file   Social History Narrative  . No narrative on file    History reviewed. No pertinent family history.  Current Outpatient Prescriptions  Medication Sig Dispense Refill  . lisinopril (PRINIVIL,ZESTRIL) 10 MG tablet       . loratadine (CLARITIN) 10 MG tablet Take 10 mg by mouth daily.      . Multiple Vitamin (MULTIVITAMIN) capsule Take 1 capsule by mouth daily.       No current facility-administered medications for this visit.     No Known Allergies  BP 158/90  Pulse 64  Temp(Src) 97.8 F (36.6 C) (Temporal)  Resp 16  Ht 5' 11" (1.803 m)  Wt 183 lb (83.008 kg)  BMI 25.53 kg/m2  No results found.   Review of Systems  Constitutional: Negative for fever, chills and diaphoresis.  HENT: Negative for nosebleeds, sore throat, facial swelling, mouth sores, trouble swallowing and ear discharge.   Eyes: Negative for photophobia, discharge and visual disturbance.  Respiratory: Negative for choking, chest tightness, shortness of breath and stridor.   Cardiovascular: Negative for chest pain and palpitations.  Gastrointestinal: Positive   for nausea and abdominal pain. Negative for vomiting, diarrhea, constipation, blood in stool, abdominal distention, anal bleeding and rectal pain.  Endocrine: Negative for cold intolerance and heat intolerance.  Genitourinary: Negative for dysuria, urgency, frequency, flank pain, difficulty urinating and testicular pain.  Musculoskeletal: Negative for myalgias, back pain, arthralgias and gait problem.  Skin: Negative for color change, pallor, rash and wound.  Allergic/Immunologic: Negative for environmental allergies and food allergies.  Neurological: Negative for dizziness, speech difficulty, weakness, numbness and headaches.  Hematological: Negative for adenopathy. Does  not bruise/bleed easily.  Psychiatric/Behavioral: Negative for hallucinations, confusion and agitation.       Objective:   Physical Exam  Constitutional: He is oriented to person, place, and time. He appears well-developed and well-nourished. No distress.  HENT:  Head: Normocephalic.  Mouth/Throat: Oropharynx is clear and moist. No oropharyngeal exudate.  Eyes: Conjunctivae and EOM are normal. Pupils are equal, round, and reactive to light. No scleral icterus.  Neck: Normal range of motion. Neck supple. No tracheal deviation present.  Cardiovascular: Normal rate, regular rhythm and intact distal pulses.   Pulmonary/Chest: Effort normal and breath sounds normal. No respiratory distress.  Abdominal: Soft. He exhibits no distension. There is tenderness. There is no rigidity, no rebound, no guarding, no CVA tenderness and no tenderness at McBurney's point. No hernia. Hernia confirmed negative in the right inguinal area and confirmed negative in the left inguinal area.    Musculoskeletal: Normal range of motion. He exhibits no tenderness.  Lymphadenopathy:    He has no cervical adenopathy.       Right: No inguinal adenopathy present.       Left: No inguinal adenopathy present.  Neurological: He is alert and oriented to person, place, and time. No cranial nerve deficit. He exhibits normal muscle tone. Coordination normal.  Skin: Skin is warm and dry. No rash noted. He is not diaphoretic. No erythema. No pallor.  Psychiatric: He has a normal mood and affect. His behavior is normal. Judgment and thought content normal.   Ultrasound in 2010 shows normal gallbladder without stones.  Repeated ultrasound done at outside facility this year notes gallstones.  Labs attached.  Normal liver function tests, amylase, lipase    Assessment:     Right upper quadrant pain with nausea and bloating and gallstones.  Rest of differential diagnosis unlikely.     Plan:     Given the chronic history and  resolution differential diagnosis unlikely, I suspect gallstones are the etiology of his symptoms.  I offered him see a gastroenterologist to do upper and lower endoscopy to rule that out.  However, with mild controlled heartburn and no irregular bowel habits to suspect a colon Issue, nor any family history; I am doubtful they will find anything.  I would proceed with surgery first.  He and his son agree.  I did caution him this may not solve the problem but I am hopeful.  They agree.  The anatomy & physiology of hepatobiliary & pancreatic function was discussed.  The pathophysiology of gallbladder dysfunction was discussed.  Natural history risks without surgery was discussed.   I feel the risks of no intervention will lead to serious problems that outweigh the operative risks; therefore, I recommended cholecystectomy to remove the pathology.  I explained laparoscopic techniques with possible need for an open approach.  Probable cholangiogram to evaluate the bilary tract was explained as well.    Risks such as bleeding, infection, abscess, leak, injury to other organs, need for further treatment,   heart attack, death, and other risks were discussed.  I noted a good likelihood this will help address the problem.  Possibility that this will not correct all abdominal symptoms was explained.  Goals of post-operative recovery were discussed as well.  We will work to minimize complications.  An educational handout further explaining the pathology and treatment options was given as well.  Questions were answered.  The patient expresses understanding & wishes to proceed with surgery.         

## 2013-01-26 NOTE — Anesthesia Procedure Notes (Signed)
Procedure Name: Intubation Date/Time: 01/26/2013 11:05 AM Performed by: Enriqueta Shutter Patient Re-evaluated:Patient Re-evaluated prior to inductionOxygen Delivery Method: Circle system utilized Preoxygenation: Pre-oxygenation with 100% oxygen Intubation Type: IV induction Ventilation: Mask ventilation without difficulty and Oral airway inserted - appropriate to patient size Laryngoscope Size: Miller and 3 Grade View: Grade I Tube type: Oral Tube size: 8.0 mm Number of attempts: 1 Airway Equipment and Method: Stylet Placement Confirmation: ETT inserted through vocal cords under direct vision,  breath sounds checked- equal and bilateral and positive ETCO2 Secured at: 22 cm Tube secured with: Tape Dental Injury: Teeth and Oropharynx as per pre-operative assessment

## 2013-01-26 NOTE — Transfer of Care (Signed)
Immediate Anesthesia Transfer of Care Note  Patient: Taylor Terry  Procedure(s) Performed: Procedure(s): LAPAROSCOPIC CHOLECYSTECTOMY WITH INTRAOPERATIVE CHOLANGIOGRAM WITH ONE PORT SINGLE SITE (N/A)  Patient Location: PACU  Anesthesia Type:General  Level of Consciousness: awake, alert  and oriented  Airway & Oxygen Therapy: Patient Spontanous Breathing and Patient connected to face mask oxygen  Post-op Assessment: Report given to PACU RN and Post -op Vital signs reviewed and stable  Post vital signs: Reviewed and stable  Complications: No apparent anesthesia complications

## 2013-01-26 NOTE — Anesthesia Preprocedure Evaluation (Addendum)
Anesthesia Evaluation  Patient identified by MRN, date of birth, ID band Patient awake    Reviewed: Allergy & Precautions, H&P , NPO status , Patient's Chart, lab work & pertinent test results  Airway Mallampati: II TM Distance: >3 FB Neck ROM: Full    Dental  (+) Dental Advisory Given, Teeth Intact and Missing   Pulmonary former smoker,  breath sounds clear to auscultation        Cardiovascular hypertension, Pt. on medications Rhythm:Regular Rate:Normal     Neuro/Psych negative neurological ROS  negative psych ROS   GI/Hepatic negative GI ROS, Neg liver ROS,   Endo/Other  negative endocrine ROS  Renal/GU negative Renal ROS     Musculoskeletal negative musculoskeletal ROS (+)   Abdominal   Peds  Hematology negative hematology ROS (+)   Anesthesia Other Findings   Reproductive/Obstetrics                          Anesthesia Physical Anesthesia Plan  ASA: II  Anesthesia Plan: General   Post-op Pain Management:    Induction: Intravenous  Airway Management Planned: Oral ETT  Additional Equipment:   Intra-op Plan:   Post-operative Plan: Extubation in OR  Informed Consent: I have reviewed the patients History and Physical, chart, labs and discussed the procedure including the risks, benefits and alternatives for the proposed anesthesia with the patient or authorized representative who has indicated his/her understanding and acceptance.   Dental advisory given  Plan Discussed with: CRNA  Anesthesia Plan Comments:         Anesthesia Quick Evaluation

## 2013-01-26 NOTE — Anesthesia Postprocedure Evaluation (Signed)
Anesthesia Post Note  Patient: Taylor Terry  Procedure(s) Performed: Procedure(s) (LRB): LAPAROSCOPIC CHOLECYSTECTOMY WITH INTRAOPERATIVE CHOLANGIOGRAM WITH ONE PORT SINGLE SITE (N/A)  Anesthesia type: General  Patient location: PACU  Post pain: Pain level controlled  Post assessment: Post-op Vital signs reviewed  Last Vitals: BP 131/74  Pulse 82  Temp(Src) 36.5 C (Oral)  Resp 18  SpO2 98%  Post vital signs: Reviewed  Level of consciousness: sedated  Complications: No apparent anesthesia complications

## 2013-01-26 NOTE — Op Note (Signed)
01/26/2013  12:28 PM  PATIENT:  Taylor Terry  47 y.o. male  Patient Care Team: Alois Cliche as PCP - General (Physician Assistant)  PRE-OPERATIVE DIAGNOSIS:  sympotomatic billary colic, probable chronic cholecystitis  POST-OPERATIVE DIAGNOSIS:  sympotomatic billary colic, probable chronic cholecystitis  PROCEDURE:  Procedure(s): LAPAROSCOPIC CHOLECYSTECTOMY WITH INTRAOPERATIVE CHOLANGIOGRAM - SINGLE SITE  SURGEON:  Surgeon(s): Ardeth Sportsman, MD  ASSISTANT: PA & RN   ANESTHESIA:   local and general  EBL:  Total I/O In: 1000 [I.V.:1000] Out: -   Delay start of Pharmacological VTE agent (>24hrs) due to surgical blood loss or risk of bleeding:  no  DRAINS: none   SPECIMEN:  Source of Specimen:  Gallbladder  DISPOSITION OF SPECIMEN:  PATHOLOGY  COUNTS:  YES  PLAN OF CARE: Discharge to home after PACU  PATIENT DISPOSITION:  PACU - hemodynamically stable.  INDICATION: Pleasant Hispanic male with intermittent abdominal pain for five years.  It has become more intense and constant.  More focused in the right upper side.  He does have a history of reflux which is controlled with Zantac.  This does not seem like that.  Evaluation concerning for gallstones.  I recommended cholecystectomy  The anatomy & physiology of hepatobiliary & pancreatic function was discussed.  The pathophysiology of gallbladder dysfunction was discussed.  Natural history risks without surgery was discussed.   I feel the risks of no intervention will lead to serious problems that outweigh the operative risks; therefore, I recommended cholecystectomy to remove the pathology.  I explained laparoscopic techniques with possible need for an open approach.  Probable cholangiogram to evaluate the bilary tract was explained as well.    Risks such as bleeding, infection, abscess, leak, injury to other organs, need for further treatment, heart attack, death, and other risks were discussed.  I noted a good likelihood  this will help address the problem.  Possibility that this will not correct all abdominal symptoms was explained.  Goals of post-operative recovery were discussed as well.  We will work to minimize complications.  An educational handout further explaining the pathology and treatment options was given as well.  Questions were answered.  The patient expresses understanding & wishes to proceed with surgery.   OR FINDINGS: Gallbladder wall thickening consistent with chronic cholecystitis.  Solitary gallstone.  Normal biliary anatomy.  DESCRIPTION:   The patient was identified & brought in the operating room. The patient was positioned supine with arms tucked. SCDs were active during the entire case. The patient underwent general anesthesia without any difficulty.  The abdomen was prepped and draped in a sterile fashion. A Surgical Timeout confirmed our plan.  I made a transverse curvilinear incision through the superior umbilical fold.  I placed a 5mm long port through the supraumbilical fascia using a modified Hassan cutdown technique. I began carbon dioxide insufflation. Camera inspection revealed no injury. There were no adhesions to the anterior abdominal wall supraumbilically.  I proceeded to continue with single site technique. I placed a #5 port in left upper aspect of the wound. I placed a 5 mm atraumatic grasper in the right inferior aspect of the wound.  I turned attention to the right upper quadrant. The gallbladder fundus was elevated cephalad.  No major adhesions to the gallbladder.  Just a few wispy ones of mesocolon and duodenum.  These were carefully freed off.  I freed the peritoneal coverings between the gallbladder and the liver on the posteriolateral and anteriomedial walls. I alternated between Harmonic & blunt [place]  dissection to help get a good critical view of the cystic artery and cystic duct. I did further dissection to free a few centimeters of the  gallbladder off the liver  bed to get a good critical view of the infundibulum and cystic duct. I mobilized the cystic artery; and, after getting a good 360 view, ligated the cystic artery using the Harmonic ultrasonic dissection. I skeletonized the cystic duct.  I placed a clip on the infundibulum. I did a partial cystic duct-otomy and ensured patency. I placed a 5 [place] cholangiocatheter through a puncture site at the right subcostal ridge of the abdominal wall and directed it into the cystic duct.  We ran a cholangiogram with dilute radio-opaque contrast and continuous fluoroscopy. Contrast flowed from a side branch consistent with cystic duct cannulization. Contrast flowed up the common hepatic duct into the right and left intrahepatic chains out to secondary radicals. Contrast flowed down the common bile duct easily across the normal ampulla into the duodenum.  This was consistent with a normal cholangiogram.  I removed the cholangiocatheter. I placed clips on the cystic duct x5.  I completed cystic duct transection. I freed the gallbladder from its remaining attachments to the liver. I ensured hemostasis on the gallbladder fossa of the liver and elsewhere. I inspected the rest of the abdomen & detected no injury nor bleeding elsewhere.  I removed the gallbladder out the supraumbilical fascia. I closed the fascia transversely using 0 Vicryl interrupted stitches. A closed the skin using 4-0 monocryl stitch.  Sterile dressing was applied. The patient was extubated & arrived in the PACU in stable condition..  I had discussed postoperative care with the patient in the office & again in the holding area.  I am about to locate the patient's family and discuss operative findings and postoperative goals / instructions.  Instructions are written in the chart as well.

## 2013-01-27 ENCOUNTER — Encounter (INDEPENDENT_AMBULATORY_CARE_PROVIDER_SITE_OTHER): Payer: Self-pay

## 2013-01-27 ENCOUNTER — Encounter (HOSPITAL_COMMUNITY): Payer: Self-pay | Admitting: Surgery

## 2013-02-02 ENCOUNTER — Encounter (INDEPENDENT_AMBULATORY_CARE_PROVIDER_SITE_OTHER): Payer: Self-pay

## 2013-02-15 ENCOUNTER — Ambulatory Visit (INDEPENDENT_AMBULATORY_CARE_PROVIDER_SITE_OTHER): Payer: PRIVATE HEALTH INSURANCE | Admitting: Surgery

## 2013-02-15 ENCOUNTER — Encounter (INDEPENDENT_AMBULATORY_CARE_PROVIDER_SITE_OTHER): Payer: Self-pay | Admitting: Surgery

## 2013-02-15 VITALS — BP 122/78 | HR 76 | Temp 98.6°F | Resp 16 | Ht 71.0 in | Wt 186.0 lb

## 2013-02-15 DIAGNOSIS — K8010 Calculus of gallbladder with chronic cholecystitis without obstruction: Secondary | ICD-10-CM

## 2013-02-15 NOTE — Patient Instructions (Addendum)
LAPAROSCOPIC SURGERY: POST OP INSTRUCTIONS  1. DIET: Follow a light bland diet the first 24 hours after arrival home, such as soup, liquids, crackers, etc.  Be sure to include lots of fluids daily.  Avoid fast food or heavy meals as your are more likely to get nauseated.  Eat a low fat the next few days after surgery.   2. Take your usually prescribed home medications unless otherwise directed. 3. PAIN CONTROL: a. Pain is best controlled by a usual combination of three different methods TOGETHER: i. Ice/Heat ii. Over the counter pain medication iii. Prescription pain medication b. Most patients will experience some swelling and bruising around the incisions.  Ice packs or heating pads (30-60 minutes up to 6 times a day) will help. Use ice for the first few days to help decrease swelling and bruising, then switch to heat to help relax tight/sore spots and speed recovery.  Some people prefer to use ice alone, heat alone, alternating between ice & heat.  Experiment to what works for you.  Swelling and bruising can take several weeks to resolve.   c. It is helpful to take an over-the-counter pain medication regularly for the first few weeks.  Choose one of the following that works best for you: i. Naproxen (Aleve, etc)  Two 236m tabs twice a day ii. Ibuprofen (Advil, etc) Three 2060mtabs four times a day (every meal & bedtime) iii. Acetaminophen (Tylenol, etc) 500-65025mour times a day (every meal & bedtime) d. A  prescription for pain medication (such as oxycodone, hydrocodone, etc) should be given to you upon discharge.  Take your pain medication as prescribed.  i. If you are having problems/concerns with the prescription medicine (does not control pain, nausea, vomiting, rash, itching, etc), please call us [phone] see if we need to switch you to a different pain medicine that will work better for you and/or control your side effect better. ii. If you need a refill on your pain medication,  please contact your pharmacy.  They will contact our office to request authorization. Prescriptions will not be filled after 5 pm or on week-ends. 4. Avoid getting constipated.  Between the surgery and the pain medications, it is common to experience some constipation.  Increasing fluid intake and taking a fiber supplement (such as Metamucil, Citrucel, FiberCon, MiraLax, etc) 1-2 times a day regularly will usually help prevent this problem from occurring.  A mild laxative (prune juice, Milk of Magnesia, MiraLax, etc) should be taken according to package directions if there are no bowel movements after 48 hours.   5. Watch out for diarrhea.  If you have many loose bowel movements, simplify your diet to bland foods & liquids for a few days.  Stop any stool softeners and decrease your fiber supplement.  Switching to mild anti-diarrheal medications (Kayopectate, Pepto Bismol) can help.  If this worsens or does not improve, please call us.[place]. Wash / shower every day.  You may shower over the dressings as they are waterproof.  Continue to shower over incision(s) after the dressing is off. 7. Remove your waterproof bandages 5 days after surgery.  You may leave the incision open to air.  You may replace a dressing/Band-Aid to cover the incision for comfort if you wish.  8. ACTIVITIES as tolerated:   a. You may resume regular (light) daily activities beginning the next day-such as daily self-care, walking, climbing stairs-gradually increasing activities as tolerated.  If you can walk 30 minutes without difficulty, it  is safe to try more intense activity such as jogging, treadmill, bicycling, low-impact aerobics, swimming, etc. b. Save the most intensive and strenuous activity for last such as sit-ups, heavy lifting, contact sports, etc  Refrain from any heavy lifting or straining until you are off narcotics for pain control.   c. DO NOT PUSH THROUGH PAIN.  Let pain be your guide: If it hurts to do something, don't do  it.  Pain is your body warning you to avoid that activity for another week until the pain goes down. d. You may drive when you are no longer taking prescription pain medication, you can comfortably wear a seatbelt, and you can safely maneuver your car and apply brakes. e. You may have sexual intercourse when it is comfortable.  9. FOLLOW UP in our office a. Please call [facility] at [phone] to set up an appointment to see your surgeon in the office for a follow-up appointment approximately 2-3 weeks after your surgery. b. Make sure that you call for this appointment the day you arrive home to insure a convenient appointment time. 10. IF YOU HAVE DISABILITY OR FAMILY LEAVE FORMS, BRING THEM TO THE OFFICE FOR PROCESSING.  DO NOT GIVE THEM TO YOUR DOCTOR.   WHEN TO CALL US [phone]: 1. Poor pain control 2. Reactions / problems with new medications (rash/itching, nausea, etc)  3. Fever over 101.5 F (38.5 C) 4. Inability to urinate 5. Nausea and/or vomiting 6. Worsening swelling or bruising 7. Continued bleeding from incision. 8. Increased pain, redness, or drainage from the incision   The clinic staff is available to answer your questions during regular business hours (8:30am-5pm).  Please don't hesitate to call and ask to speak to one of our nurses for clinical concerns.   If you have a medical emergency, go to the nearest emergency room or call 911.  A surgeon from [facility] is always on call at the hospitals   [facility] [address], [address], [place], [place]  [address] ? MAIN: [phone] ? TOLL FREE: [phone] ?  FAX [phone] [email or web address]  GETTING TO GOOD BOWEL HEALTH. Irregular bowel habits such as constipation and diarrhea can lead to many problems over time.  Having one soft bowel movement a day is the most important way to prevent further problems.  The anorectal canal is designed to handle stretching  and feces to safely manage our ability to get rid of solid waste (feces, poop, stool) out of our body.  BUT, hard constipated stools can act like ripping concrete bricks and diarrhea can be a burning fire to this very sensitive area of our body, causing inflamed hemorrhoids, anal fissures, increasing risk is perirectal abscesses, abdominal pain/bloating, an making irritable bowel worse.     The goal: ONE SOFT BOWEL MOVEMENT A DAY!  To have soft, regular bowel movements:    Drink at least 8 tall glasses of water a day.     Take plenty of fiber.  Fiber is the undigested part of plant food that passes into the colon, acting s "natures broom" to encourage bowel motility and movement.  Fiber can absorb and hold large amounts of water. This results in a larger, bulkier stool, which is soft and easier to pass. Work gradually over several weeks up to 6 servings a day of fiber (25g a day even more if needed) in the form of: o Vegetables -- Root (potatoes, carrots, turnips), leafy green (lettuce, salad greens, celery,   spinach), or cooked high residue (cabbage, broccoli, etc) o Fruit -- Fresh (unpeeled skin & pulp), Dried (prunes, apricots, cherries, etc ),  or stewed ( applesauce)  o Whole grain breads, pasta, etc (whole wheat)  o Bran cereals    Bulking Agents -- This type of water-retaining fiber generally is easily obtained each day by one of the following:  o Psyllium bran -- The psyllium plant is remarkable because its ground seeds can retain so much water. This product is available as Metamucil, Konsyl, Effersyllium, Per Diem Fiber, or the less expensive generic preparation in drug and health food stores. Although labeled a laxative, it really is not a laxative.  o Methylcellulose -- This is another fiber derived from wood which also retains water. It is available as Citrucel. o Polyethylene Glycol - and "artificial" fiber commonly called Miralax or Glycolax.  It is helpful for people with gassy or bloated  feelings with regular fiber o Flax Seed - a less gassy fiber than psyllium   No reading or other relaxing activity while on the toilet. If bowel movements take longer than 5 minutes, you are too constipated   AVOID CONSTIPATION.  High fiber and water intake usually takes care of this.  Sometimes a laxative is needed to stimulate more frequent bowel movements, but    Laxatives are not a good long-term solution as it can wear the colon out. o Osmotics (Milk of Magnesia, Fleets phosphosoda, Magnesium citrate, MiraLax, GoLytely) are safer than  o Stimulants (Senokot, Castor Oil, Dulcolax, Ex Lax)    o Do not take laxatives for more than 7days in a row.    IF SEVERELY CONSTIPATED, try a Bowel Retraining Program: o Do not use laxatives.  o Eat a diet high in roughage, such as bran cereals and leafy vegetables.  o Drink six (6) ounces of prune or apricot juice each morning.  o Eat two (2) large servings of stewed fruit each day.  o Take one (1) heaping tablespoon of a psyllium-based bulking agent twice a day. Use sugar-free sweetener when possible to avoid excessive calories.  o Eat a normal breakfast.  o Set aside 15 minutes after breakfast to sit on the toilet, but do not strain to have a bowel movement.  o If you do not have a bowel movement by the third day, use an enema and repeat the above steps.    Controlling diarrhea o Switch to liquids and simpler foods for a few days to avoid stressing your intestines further. o Avoid dairy products (especially milk & ice cream) for a short time.  The intestines often can lose the ability to digest lactose when stressed. o Avoid foods that cause gassiness or bloating.  Typical foods include beans and other legumes, cabbage, broccoli, and dairy foods.  Every person has some sensitivity to other foods, so listen to our body and avoid those foods that trigger problems for you. o Adding fiber (Citrucel, Metamucil, psyllium, Miralax) gradually can help thicken  stools by absorbing excess fluid and retrain the intestines to act more normally.  Slowly increase the dose over a few weeks.  Too much fiber too soon can backfire and cause cramping & bloating. o Probiotics (such as active yogurt, Align, etc) may help repopulate the intestines and colon with normal bacteria and calm down a sensitive digestive tract.  Most studies show it to be of mild help, though, and such products can be costly. o Medicines:   Bismuth subsalicylate (ex. Kayopectate, [place]) every  30 minutes for up to 6 doses can help control diarrhea.  Avoid if pregnant.   Loperamide (Immodium) can slow down diarrhea.  Start with two tablets (4mg  total) first and then try one tablet every 6 hours.  Avoid if you are having fevers or severe pain.  If you are not better or start feeling worse, stop all medicines and call your doctor for advice o Call your doctor if you are getting worse or not better.  Sometimes further testing (cultures, endoscopy, X-ray studies, bloodwork, etc) may be needed to help diagnose and treat the cause of the diarrhea.  Colecistitis  (Cholecystitis)  La colecistitis es la inflamacin de la vescula biliar. Generalmente la causa es la formacin de clculos biliares o sedimentos (colelitiasis)) en la vescula. La vescula almacena un lquido que ayuda a digerir las grasas (bilis). La colecistitis es una enfermedad grave y requiere tratamiento inmediato.  CAUSAS   Clculos biliares. Los clculos biliares pueden obstruir el conducto que conduce a la vescula biliar, causando la acumulacin de bilis. Cuando la bilis se acumula, la vescula biliar se inflama.  Problemas en el conducto biliar, como la obstruccin por cicatrizacin o torsin.  Tumores. Los tumores pueden impedir que la bilis salga de la vescula adecuadamente, causando la acumulacin de la misma. Cuando la bilis se acumula, la vescula se inflama. SNTOMAS   Nuseas  Vmitos.  Dolor abdominal,  especialmente en la zona superior derecha del abdomen.  Sensibilidad o hinchazn abdominal.  Sudoracin.  Escalofros.  Grant Ruts.  Color amarillo de la piel y en la zona blanca del ojo (ictericia). DIAGNSTICO  Su mdico puede indicar exmenes de sangre para Engineer, manufacturing una infeccin o problemas en la vescula biliar. Tambin puede ordenar pruebas de diagnstico por imgenes, como ecografas o tomografa computada (CT). Otras pruebas pueden incluir un estudio de gammagrafa hepatobiliar con cido iminodiactico (HIDA). Esta exploracin permite a su mdico ver el paso de la bilis desde el hgado hasta la vescula biliar y el intestino delgado.  TRATAMIENTO  La hospitalizacin suele ser necesaria para disminuir la inflamacin de la vescula biliar. Posiblemente le indiquen que no coma ni beba nada (ayuno) durante cierto perodo de [place]. Podrn indicarle un medicamento para calmar el dolor o un antibitico para tratar la infeccin. Puede ser necesario realizar Neomia Dear ciruga para extirpar la vescula biliar (colecistectoma) cuando la inflamacin haya disminudo. Podra necesitar de inmediato una ciruga si aparecen complicaciones como la muerte del tejido de la vescula biliar (gangrena) o la ruptura (perforacin)) de la vescula biliar.  INSTRUCCIONES PARA EL CUIDADO EN EL HOGAR  El cuidado en el hogar depender del tipo de [place]. En general:   Si le han recetado antibiticos, tmelos segn las indicaciones. Tmelos todos, aunque se sienta mejor.  Solo tome medicamentos de venta libre o recetados para Chief Technology Officer, Dentist o [place], segn las indicaciones del mdico.  Siga una dieta baja en grasas hasta que vuelva a ver al mdico nuevamente.  Cumpla con todas las visitas de control, segn le indique su mdico. SOLICITE ATENCIN MDICA DE INMEDIATO SI:   El dolor aumenta y no puede controlarlo con los medicamentos.  El dolor se traslada hacia alguna otra zona del abdomen o hacia la  espalda.  Tiene fiebre.  Tiene nuseas o vmitos. ASEGRESE DE QUE:   Comprende estas instrucciones.  Controlar su enfermedad.  Solicitar ayuda de inmediato si no mejora o si empeora. Document Released: 05/06/2005 Document Revised: 10/19/2011 [facility] Patient Information 2014 [place], [place].

## 2013-02-15 NOTE — Progress Notes (Signed)
Subjective:     Patient ID: Taylor Terry, male   DOB: [birth year], 47 y.o.   MRN: [id number]  HPI  Taylor Terry  17-Apr-1966 [id number]  Patient Care Team: Alois Cliche as PCP - General (Physician Assistant)  This patient is a 47 y.o.male who presents today for surgical evaluation Diagnosis  POST-OPERATIVE DIAGNOSIS: sympotomatic billary colic, probable chronic cholecystitis  PROCEDURE: Procedure(s):  LAPAROSCOPIC CHOLECYSTECTOMY WITH INTRAOPERATIVE CHOLANGIOGRAM - SINGLE SITE  SURGEON: Surgeon(s):  Ardeth Sportsman, MD  ASSISTANT: PA & RN    Gallbladder - CHRONIC CHOLECYSTITIS. - CHOLELITHIASIS. - CHOLESTEROLOSIS. [name] DO Pathologist, Electronic Signature (Case signed 01/27/2013) Specimen [name] and Clinical Information Specimen(s) Obtained: Gallbladder Specimen Clinical Information Symptomatic bilary colic, probable chronic cholecystitis (je) Alyssah Algeo Size/?Intact: 10.6 x 3.5 x 2.3 cm intact gallbladder, received in formalin. Serosal surface: Smooth and hyperemic. Mucosa/Wall: Glistening, bile stained and partially covered by yellow flecks. Wall measures up to 0.4 cm in thickness. Contents: The gallbladder contains bile and a 0.8 cm green calculus. Cystic duct: 0.2 cm in length and patent. Block Summary: One block submitted. (GP:gt, 01/26/13) Report signed out from the following location(s) [facility] [address], [place], [place] [address]. CLIA #: [id number],  Patient comes in today feeling well.  He comes in with his daughter.  No nausea or vomiting.   Eating well.  No diarrhea.  Soreness down.  Energy level good.  No events.  Patient Active Problem List   Diagnosis Date Noted  . Chronic cholecystitis with calculus 01/17/2013  . Hypertension     Past Medical History  Diagnosis Date  . Hypertension   . Allergy     Past Surgical History  Procedure Laterality Date  . Inguinal hernia repair Right 11/08/2008    [facility], Dr. Donell Beers  . Leg surgery       left  . Cholecystectomy N/A 01/26/2013    Procedure: LAPAROSCOPIC CHOLECYSTECTOMY WITH INTRAOPERATIVE CHOLANGIOGRAM WITH ONE PORT SINGLE SITE;  Surgeon: Ardeth Sportsman, MD;  Location: [facility];  Service: General;  Laterality: N/A;    History   Social History  . Marital Status: Married    Spouse Name: N/A    Number of Children: N/A  . Years of Education: N/A   Occupational History  .     Social History Main Topics  . Smoking status: Former Smoker    Types: Cigarettes    Quit date: 01/24/1979  . Smokeless tobacco: Never Used  . Alcohol Use: Yes     Comment: rare  . Drug Use: No  . Sexually Active: Not on file   Other Topics Concern  . Not on file   Social History Narrative   Speaks some English.  Relies on son as translator    History reviewed. No pertinent family history.  Current Outpatient Prescriptions  Medication Sig Dispense Refill  . lisinopril (PRINIVIL,ZESTRIL) 10 MG tablet Take 10 mg by mouth every morning.       . loratadine (CLARITIN) 10 MG tablet Take 10 mg by mouth daily.      . Multiple Vitamin (MULTIVITAMIN) capsule Take 1 capsule by mouth daily.      Marland Kitchen omeprazole (PRILOSEC OTC) 20 MG tablet Take 20 mg by mouth daily.      Marland Kitchen oxyCODONE (OXY IR/ROXICODONE) 5 MG immediate release tablet Take 1-2 tablets (5-10 mg total) by mouth every 4 (four) hours as needed for pain.  50 tablet  0   No current facility-administered medications for this visit.  No Known Allergies  BP 122/78  Pulse 76  Temp(Src) 98.6 F (37 C) (Temporal)  Resp 16  Ht 5\' 11"  (1.803 m)  Wt 186 lb (84.369 kg)  BMI 25.95 kg/m2  Dg Chest 2 View  01/23/2013   *RADIOLOGY REPORT*  Clinical Data: Hypertension.  CHEST - 2 VIEW  Comparison: None.  Findings: Cardiomediastinal silhouette appears normal.  No acute pulmonary disease is noted.  Bony thorax is intact.  IMPRESSION: No acute cardiopulmonary abnormality seen.   Original Report Authenticated By: Lupita Raider.,  M.D.   Dg  Cholangiogram Operative  01/26/2013   *RADIOLOGY REPORT*  Clinical Data: Right upper abdominal pain  INTRAOPERATIVE CHOLANGIOGRAM  Technique:  Multiple fluoroscopic spot radiographs were obtained during intraoperative cholangiogram and are submitted for interpretation post-operatively.  Comparison: None.  Findings: No persistent filling defects in the common duct. Intrahepatic ducts are incompletely visualized, appearing decompressed centrally. Contrast passes into the duodenum.  IMPRESSION  Negative for retained common duct stone.   Original Report Authenticated By: D. Andria Rhein, MD     Review of Systems  Constitutional: Negative for fever, chills and diaphoresis.  HENT: Negative for sore throat, trouble swallowing and neck pain.   Eyes: Negative for photophobia and visual disturbance.  Respiratory: Negative for choking and shortness of breath.   Cardiovascular: Negative for chest pain and palpitations.  Gastrointestinal: Negative for nausea, vomiting, abdominal distention, anal bleeding and rectal pain.  Genitourinary: Negative for dysuria, urgency, difficulty urinating and testicular pain.  Musculoskeletal: Negative for myalgias, arthralgias and gait problem.  Skin: Negative for color change and rash.  Neurological: Negative for dizziness, speech difficulty, weakness and numbness.  Hematological: Negative for adenopathy.  Psychiatric/Behavioral: Negative for hallucinations, confusion and agitation.       Objective:   Physical Exam  Constitutional: He is oriented to person, place, and time. He appears well-developed and well-nourished. No distress.  HENT:  Head: Normocephalic.  Mouth/Throat: Oropharynx is clear and moist. No oropharyngeal exudate.  Eyes: Conjunctivae and EOM are normal. Pupils are equal, round, and reactive to light. No scleral icterus.  Neck: Normal range of motion. No tracheal deviation present.  Cardiovascular: Normal rate, normal heart sounds and intact distal  pulses.   Pulmonary/Chest: Effort normal. No respiratory distress.  Abdominal: Soft. He exhibits no distension. There is no tenderness. Hernia confirmed negative in the right inguinal area and confirmed negative in the left inguinal area.  Incisions clean with normal healing ridges.  No hernias  Musculoskeletal: Normal range of motion. He exhibits no tenderness.  Neurological: He is alert and oriented to person, place, and time. No cranial nerve deficit. He exhibits normal muscle tone. Coordination normal.  Skin: Skin is warm and dry. No rash noted. He is not diaphoretic.  Psychiatric: He has a normal mood and affect. His behavior is normal.       Assessment:     Recovering well almost 3 weeks from laparoscopic cholecystectomy for chronic cholecystitis.     Plan:     Increase activity as tolerated to regular activity.  Low impact exercise such as walking an hour a day at least ideal.  Do not push through pain.  Diet as tolerated.  Low fat high fiber diet ideal.  Bowel regimen with 30 g fiber a day and fiber supplement as needed to avoid problems.  Return to clinic as needed.   Instructions discussed.  Followup with primary care physician for other health issues as would normally be done.  Questions answered.  The  patient expressed understanding and appreciation

## 2013-02-26 ENCOUNTER — Encounter (HOSPITAL_COMMUNITY): Payer: Self-pay | Admitting: Nurse Practitioner

## 2013-02-26 ENCOUNTER — Encounter (HOSPITAL_COMMUNITY): Payer: Self-pay | Admitting: Anesthesiology

## 2013-02-26 ENCOUNTER — Encounter (HOSPITAL_COMMUNITY)
Admission: EM | Disposition: A | Discharge status: Home / Self Care | Payer: Self-pay | Source: Home / Self Care | Attending: Emergency Medicine

## 2013-02-26 ENCOUNTER — Emergency Department (HOSPITAL_COMMUNITY): Payer: Worker's Compensation

## 2013-02-26 ENCOUNTER — Emergency Department (HOSPITAL_COMMUNITY)
Admission: EM | Admit: 2013-02-26 | Discharge: 2013-02-26 | Disposition: A | Discharge status: Home / Self Care | Payer: Worker's Compensation | Attending: Emergency Medicine | Admitting: Emergency Medicine

## 2013-02-26 ENCOUNTER — Emergency Department (HOSPITAL_COMMUNITY): Payer: Worker's Compensation | Admitting: Anesthesiology

## 2013-02-26 DIAGNOSIS — I10 Essential (primary) hypertension: Secondary | ICD-10-CM | POA: Insufficient documentation

## 2013-02-26 DIAGNOSIS — W230XXA Caught, crushed, jammed, or pinched between moving objects, initial encounter: Secondary | ICD-10-CM | POA: Insufficient documentation

## 2013-02-26 DIAGNOSIS — Y990 Civilian activity done for income or pay: Secondary | ICD-10-CM | POA: Insufficient documentation

## 2013-02-26 DIAGNOSIS — Y9289 Other specified places as the place of occurrence of the external cause: Secondary | ICD-10-CM | POA: Insufficient documentation

## 2013-02-26 DIAGNOSIS — S68629A Partial traumatic transphalangeal amputation of unspecified finger, initial encounter: Secondary | ICD-10-CM

## 2013-02-26 DIAGNOSIS — IMO0002 Reserved for concepts with insufficient information to code with codable children: Secondary | ICD-10-CM | POA: Insufficient documentation

## 2013-02-26 HISTORY — PX: AMPUTATION: SHX166

## 2013-02-26 SURGERY — AMPUTATION DIGIT
Anesthesia: General | Site: Finger | Laterality: Left | Wound class: Clean Contaminated

## 2013-02-26 MED ORDER — BUPIVACAINE HCL (PF) 0.25 % IJ SOLN
INTRAMUSCULAR | Status: DC | PRN
  Administered 2013-02-26: 4 mL

## 2013-02-26 MED ORDER — MIDAZOLAM HCL 5 MG/5ML IJ SOLN
INTRAMUSCULAR | Status: DC | PRN
  Administered 2013-02-26: 2 mg via INTRAVENOUS

## 2013-02-26 MED ORDER — HYDROMORPHONE HCL PF 1 MG/ML IJ SOLN: 0.2500 mg | INTRAMUSCULAR | Status: DC | PRN

## 2013-02-26 MED ORDER — ONDANSETRON HCL 4 MG/2ML IJ SOLN
INTRAMUSCULAR | Status: DC | PRN
  Administered 2013-02-26: 4 mg via INTRAVENOUS

## 2013-02-26 MED ORDER — TETANUS-DIPHTH-ACELL PERTUSSIS 5-2.5-18.5 LF-MCG/0.5 IM SUSP: 0.5000 mL | Freq: Once | INTRAMUSCULAR | Status: DC

## 2013-02-26 MED ORDER — DEXAMETHASONE SODIUM PHOSPHATE 4 MG/ML IJ SOLN
INTRAMUSCULAR | Status: DC | PRN
  Administered 2013-02-26: 8 mg via INTRAVENOUS

## 2013-02-26 MED ORDER — METOCLOPRAMIDE HCL 5 MG/ML IJ SOLN
INTRAMUSCULAR | Status: DC | PRN
  Administered 2013-02-26: 10 mg via INTRAVENOUS

## 2013-02-26 MED ORDER — PROPOFOL 10 MG/ML IV BOLUS
INTRAVENOUS | Status: DC | PRN
  Administered 2013-02-26: 200 mg via INTRAVENOUS

## 2013-02-26 MED ORDER — LIDOCAINE HCL (CARDIAC) 20 MG/ML IV SOLN
INTRAVENOUS | Status: DC | PRN
  Administered 2013-02-26: 100 mg via INTRAVENOUS

## 2013-02-26 MED ORDER — OXYCODONE HCL 5 MG/5ML PO SOLN: 5.0000 mg | Freq: Once | ORAL | Status: DC | PRN

## 2013-02-26 MED ORDER — OXYCODONE HCL 5 MG PO TABS: 5.0000 mg | ORAL_TABLET | Freq: Once | ORAL | Status: DC | PRN

## 2013-02-26 MED ORDER — SUCCINYLCHOLINE CHLORIDE 20 MG/ML IJ SOLN
INTRAMUSCULAR | Status: DC | PRN
  Administered 2013-02-26: 120 mg via INTRAVENOUS

## 2013-02-26 MED ORDER — OXYCODONE-ACETAMINOPHEN 5-325 MG PO TABS: 1.0000 | ORAL_TABLET | ORAL | Status: DC | PRN

## 2013-02-26 MED ORDER — METOCLOPRAMIDE HCL 5 MG/ML IJ SOLN: 10.0000 mg | Freq: Once | INTRAMUSCULAR | Status: DC | PRN

## 2013-02-26 MED ORDER — LACTATED RINGERS IV SOLN
INTRAVENOUS | Status: DC | PRN
  Administered 2013-02-26: 15:00:00 via INTRAVENOUS

## 2013-02-26 MED ORDER — 0.9 % SODIUM CHLORIDE (POUR BTL) OPTIME
TOPICAL | Status: DC | PRN
  Administered 2013-02-26: 1000 mL

## 2013-02-26 MED ORDER — FENTANYL CITRATE 0.05 MG/ML IJ SOLN
INTRAMUSCULAR | Status: DC | PRN
  Administered 2013-02-26 (×2): 125 ug via INTRAVENOUS

## 2013-02-26 MED ORDER — MORPHINE SULFATE 4 MG/ML IJ SOLN
2.0000 mg | Freq: Once | INTRAMUSCULAR | Status: AC
  Administered 2013-02-26: 2 mg via INTRAVENOUS
  Filled 2013-02-26: qty 1

## 2013-02-26 MED ORDER — CEFAZOLIN SODIUM-DEXTROSE 2-3 GM-% IV SOLR
INTRAVENOUS | Status: DC | PRN
  Administered 2013-02-26: 2 g via INTRAVENOUS

## 2013-02-26 SURGICAL SUPPLY — 43 items
BANDAGE CONFORM 2  STR LF (GAUZE/BANDAGES/DRESSINGS) IMPLANT
BANDAGE ELASTIC 3 VELCRO ST LF (GAUZE/BANDAGES/DRESSINGS) IMPLANT
BANDAGE GAUZE 4  KLING STR (GAUZE/BANDAGES/DRESSINGS) ×1 IMPLANT
BANDAGE GAUZE ELAST BULKY 4 IN (GAUZE/BANDAGES/DRESSINGS) IMPLANT
BNDG CMPR 9X4 STRL LF SNTH (GAUZE/BANDAGES/DRESSINGS) ×1
BNDG COHESIVE 1X5 TAN STRL LF (GAUZE/BANDAGES/DRESSINGS) ×1 IMPLANT
BNDG ESMARK 4X9 LF (GAUZE/BANDAGES/DRESSINGS) ×2 IMPLANT
CLOTH BEACON ORANGE TIMEOUT ST (SAFETY) ×2 IMPLANT
CORDS BIPOLAR (ELECTRODE) ×2 IMPLANT
COVER SURGICAL LIGHT HANDLE (MISCELLANEOUS) ×2 IMPLANT
CUFF TOURNIQUET SINGLE 18IN (TOURNIQUET CUFF) ×1 IMPLANT
CUFF TOURNIQUET SINGLE 24IN (TOURNIQUET CUFF) IMPLANT
DRAPE OEC MINIVIEW 54X84 (DRAPES) IMPLANT
DRAPE SURG 17X23 STRL (DRAPES) ×2 IMPLANT
DURAPREP 26ML APPLICATOR (WOUND CARE) ×1 IMPLANT
GAUZE SPONGE 2X2 8PLY STRL LF (GAUZE/BANDAGES/DRESSINGS) IMPLANT
GAUZE XEROFORM 1X8 LF (GAUZE/BANDAGES/DRESSINGS) ×1 IMPLANT
GLOVE BIO SURGEON STRL SZ8.5 (GLOVE) ×2 IMPLANT
GOWN PREVENTION PLUS XXLARGE (GOWN DISPOSABLE) ×2 IMPLANT
GOWN SRG XL XLNG 56XLVL 4 (GOWN DISPOSABLE) ×1 IMPLANT
GOWN STRL NON-REIN LRG LVL3 (GOWN DISPOSABLE) ×2 IMPLANT
GOWN STRL NON-REIN XL XLG LVL4 (GOWN DISPOSABLE) ×2
KIT BASIN OR (CUSTOM PROCEDURE TRAY) ×2 IMPLANT
KIT ROOM TURNOVER OR (KITS) ×2 IMPLANT
MANIFOLD NEPTUNE II (INSTRUMENTS) ×1 IMPLANT
NDL HYPO 25GX1X1/2 BEV (NEEDLE) IMPLANT
NEEDLE HYPO 25GX1X1/2 BEV (NEEDLE) IMPLANT
NS IRRIG 1000ML POUR BTL (IV SOLUTION) ×2 IMPLANT
PACK ORTHO EXTREMITY (CUSTOM PROCEDURE TRAY) ×2 IMPLANT
PAD ARMBOARD 7.5X6 YLW CONV (MISCELLANEOUS) ×4 IMPLANT
PAD CAST 3X4 CTTN HI CHSV (CAST SUPPLIES) IMPLANT
PADDING CAST COTTON 3X4 STRL (CAST SUPPLIES)
SPECIMEN JAR SMALL (MISCELLANEOUS) ×1 IMPLANT
SPONGE GAUZE 2X2 STER 10/PKG (GAUZE/BANDAGES/DRESSINGS)
SPONGE GAUZE 4X4 12PLY (GAUZE/BANDAGES/DRESSINGS) ×1 IMPLANT
STRIP CLOSURE SKIN 1/2X4 (GAUZE/BANDAGES/DRESSINGS) IMPLANT
SUCTION FRAZIER TIP 10 FR DISP (SUCTIONS) ×1 IMPLANT
SUT VICRYL RAPIDE 4/0 PS 2 (SUTURE) ×2 IMPLANT
TOWEL OR 17X24 6PK STRL BLUE (TOWEL DISPOSABLE) ×2 IMPLANT
TOWEL OR 17X26 10 PK STRL BLUE (TOWEL DISPOSABLE) ×2 IMPLANT
TUBE CONNECTING 12X1/4 (SUCTIONS) ×1 IMPLANT
UNDERPAD 30X30 INCONTINENT (UNDERPADS AND DIAPERS) ×2 IMPLANT
WATER STERILE IRR 1000ML POUR (IV SOLUTION) ×1 IMPLANT

## 2013-02-26 NOTE — Anesthesia Preprocedure Evaluation (Addendum)
Anesthesia Evaluation  Patient identified by MRN, date of birth, ID band Patient awake    Reviewed: Allergy & Precautions, H&P , NPO status , Patient's Chart, lab work & pertinent test results, reviewed documented beta blocker date and time   Airway Mallampati: II TM Distance: >3 FB Neck ROM: full    Dental  (+) Teeth Intact and Dental Advidsory Given   Pulmonary former smoker,  breath sounds clear to auscultation        Cardiovascular hypertension, Pt. on medications Rhythm:regular     Neuro/Psych negative neurological ROS  negative psych ROS   GI/Hepatic negative GI ROS, Neg liver ROS,   Endo/Other  negative endocrine ROS  Renal/GU negative Renal ROS  negative genitourinary   Musculoskeletal   Abdominal   Peds  Hematology negative hematology ROS (+)   Anesthesia Other Findings See surgeon's H&P   Reproductive/Obstetrics negative OB ROS                         Anesthesia Physical Anesthesia Plan  ASA: II and emergent  Anesthesia Plan: General   Post-op Pain Management:    Induction: Intravenous, Rapid sequence and Cricoid pressure planned  Airway Management Planned: Oral ETT  Additional Equipment:   Intra-op Plan:   Post-operative Plan: Extubation in OR  Informed Consent: I have reviewed the patients History and Physical, chart, labs and discussed the procedure including the risks, benefits and alternatives for the proposed anesthesia with the patient or authorized representative who has indicated his/her understanding and acceptance.   Dental Advisory Given  Plan Discussed with: CRNA, Surgeon and Anesthesiologist  Anesthesia Plan Comments:       Anesthesia Quick Evaluation

## 2013-02-26 NOTE — Anesthesia Postprocedure Evaluation (Signed)
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

## 2013-02-26 NOTE — ED Provider Notes (Signed)
History    CSN: [id number] Arrival date & time 02/26/13  1301  First MD Initiated Contact with Patient 02/26/13 1308     Chief Complaint  Patient presents with  . Finger Injury   (Consider location/radiation/quality/duration/timing/severity/associated sxs/prior Treatment) HPI Comments: 47 year old male who presents with a partially amputated left small finger. This occurred one hour prior to arrival, it was acute in onset, the pain is persistent, associated with a small amount of bleeding, he tried to preserve the residual finger in milk.  He has his finger wrapped in wet gauze at this time. He states that he does have pain in the finger, it is worse with palpation and movement, he has not had any associated injuries.  The history is provided by the patient.   Past Medical History  Diagnosis Date  . Hypertension   . Allergy    Past Surgical History  Procedure Laterality Date  . Inguinal hernia repair Right 11/08/2008    [facility], Dr. Donell Beers  . Leg surgery      left  . Cholecystectomy N/A 01/26/2013    Procedure: LAPAROSCOPIC CHOLECYSTECTOMY WITH INTRAOPERATIVE CHOLANGIOGRAM WITH ONE PORT SINGLE SITE;  Surgeon: Ardeth Sportsman, MD;  Location: [facility];  Service: General;  Laterality: N/A;   History reviewed. No pertinent family history. History  Substance Use Topics  . Smoking status: Former Smoker    Types: Cigarettes    Quit date: 01/24/1979  . Smokeless tobacco: Never Used  . Alcohol Use: Yes     Comment: rare    Review of Systems  Skin: Positive for wound.  Hematological: Does not bruise/bleed easily.    Allergies  Review of patient's allergies indicates no known allergies.  Home Medications   Current Outpatient Rx  Name  Route  Sig  Dispense  Refill  . lisinopril (PRINIVIL,ZESTRIL) 10 MG tablet   Oral   Take 10 mg by mouth every morning.          . loratadine (CLARITIN) 10 MG tablet   Oral   Take 10 mg by mouth daily.         . Multiple Vitamin  (MULTIVITAMIN) capsule   Oral   Take 1 capsule by mouth daily.         Marland Kitchen omeprazole (PRILOSEC OTC) 20 MG tablet   Oral   Take 20 mg by mouth daily.         Marland Kitchen oxyCODONE (OXY IR/ROXICODONE) 5 MG immediate release tablet   Oral   Take 1-2 tablets (5-10 mg total) by mouth every 4 (four) hours as needed for pain.   50 tablet   0    BP 156/100  Pulse 114  Temp(Src) 98.8 F (37.1 C) (Oral)  Resp 16  SpO2 99% Physical Exam  Constitutional:  Well appearing, no acute distress  HENT:  Normocephalic, atraumatic  Eyes: Conjunctivae are normal. Right eye exhibits no discharge. Left eye exhibits no discharge. No scleral icterus.  Cardiovascular: Normal rate and intact distal pulses.   Pulmonary/Chest: Effort normal.  Musculoskeletal: Normal range of motion. He exhibits tenderness ( Partial amputation of the distal phalanx of the left small finger, very proximal nailbed preserved in the central part of the dorsal finger, bone exposed, bleeding controlled).  The patient is able to flex and extend at the small finger with minimal difficulty at the distal interphalangeal joint and proximal interphalangeal joint  Neurological: He is alert. Coordination normal.  Skin: Skin is warm and dry. No rash noted. No erythema.  Amputation as describe    ED Course  Procedures (including critical care time) Labs Reviewed - No data to display No results found. 1. Partial traumatic transphalangeal amputation of finger, initial encounter     MDM  Will discuss with him surgery, imaging pending, the patient appears comfortable, I doubt that the distal end of his finger can be preserved however we have cleaned it in normal saline with gentle irrigation and placed on ice.  D/w Dr. Mina Marble who will see pt in the ED.  Xray pending,   Tetanus updated as needed, pain control given.  Vida Roller, MD 02/26/13 1332

## 2013-02-26 NOTE — Transfer of Care (Signed)
Immediate Anesthesia Transfer of Care Note  Patient: Taylor Terry  Procedure(s) Performed: Procedure(s): revision of left small finger amputation (Left)  Patient Location: PACU  Anesthesia Type:General  Level of Consciousness: awake  Airway & Oxygen Therapy: Patient Spontanous Breathing  Post-op Assessment: Report given to PACU RN and Post -op Vital signs reviewed and stable  Post vital signs: Reviewed and stable  Complications: No apparent anesthesia complications

## 2013-02-26 NOTE — Anesthesia Procedure Notes (Signed)
Procedure Name: Intubation Date/Time: 02/26/2013 2:54 PM Performed by: Alanda Amass A Pre-anesthesia Checklist: Patient identified, Timeout performed, Emergency Drugs available, Suction available and Patient being monitored Patient Re-evaluated:Patient Re-evaluated prior to inductionOxygen Delivery Method: Circle system utilized Preoxygenation: Pre-oxygenation with 100% oxygen Intubation Type: IV induction, Rapid sequence and Cricoid Pressure applied Laryngoscope Size: Mac and 3 Grade View: Grade I Tube type: Oral Tube size: 7.5 mm Number of attempts: 1 Airway Equipment and Method: Stylet Placement Confirmation: ETT inserted through vocal cords under direct vision,  breath sounds checked- equal and bilateral and positive ETCO2 Secured at: 23 cm Tube secured with: Tape Dental Injury: Teeth and Oropharynx as per pre-operative assessment

## 2013-02-26 NOTE — ED Notes (Signed)
PATIENT BELONGINGS TO FAMILY. PT SON PHONE # [phone]. HE WILL BE IN LOBBY WAITING WITH PT WIFE TO TAKE HIM HOME

## 2013-02-26 NOTE — ED Notes (Signed)
Pt amputated tip of L pinky finger in a door at work. Pt reports pain at site. Bleeding controlled with pressure by pt. Pt arrived with fingertip in a cup.

## 2013-02-26 NOTE — ED Notes (Signed)
REPORT TO SCOTT [phone]

## 2013-02-26 NOTE — ED Notes (Signed)
Patient transported to X-ray 

## 2013-02-26 NOTE — ED Notes (Signed)
DR Caprice Red HERE TO SEE PATIENT

## 2013-02-26 NOTE — Preoperative (Addendum)
Beta Blockers   Reason not to administer Beta Blockers:Not Applicable 

## 2013-02-26 NOTE — Op Note (Signed)
See note [phone]

## 2013-02-26 NOTE — Consult Note (Signed)
Reason for Consult:left small amputation Referring Physician: [name] is an 47 y.o. male.  HPI: s/p trauma to left small finger with tip amputation  Past Medical History  Diagnosis Date  . Hypertension   . Allergy     Past Surgical History  Procedure Laterality Date  . Inguinal hernia repair Right 11/08/2008    [facility], Dr. Donell Terry  . Leg surgery      left  . Cholecystectomy N/A 01/26/2013    Procedure: LAPAROSCOPIC CHOLECYSTECTOMY WITH INTRAOPERATIVE CHOLANGIOGRAM WITH ONE PORT SINGLE SITE;  Surgeon: Taylor Sportsman, MD;  Location: [facility];  Service: General;  Laterality: N/A;    History reviewed. No pertinent family history.  Social History:  reports that he quit smoking about 34 years ago. His smoking use included Cigarettes. He smoked 0.00 packs per day. He has never used smokeless tobacco. He reports that  drinks alcohol. He reports that he does not use illicit drugs.  Allergies: No Known Allergies  Medications: Scheduled:  No results found for this or any previous visit (from the past 48 hour(s)).  No results found.  Review of Systems  All other systems reviewed and are negative.   Blood pressure 156/100, pulse 114, temperature 98.8 F (37.1 C), temperature source Oral, resp. rate 16, SpO2 99.00%. Physical Exam  Constitutional: He is oriented to person, place, and time. He appears well-developed and well-nourished.  HENT:  Head: Normocephalic.  Cardiovascular: Normal rate.   Respiratory: Effort normal.  Musculoskeletal:       Left hand: He exhibits tenderness, bony tenderness and laceration.  Left small finger distal tip amputation with exposed bone  Neurological: He is alert and oriented to person, place, and time.  Skin: Skin is warm.  Psychiatric: He has a normal mood and affect. His behavior is normal. Judgment and thought content normal.    Assessment/Plan: As above   Plan revision amputation  Taylor Terry A 02/26/2013, 1:49 PM

## 2013-02-27 ENCOUNTER — Encounter (HOSPITAL_COMMUNITY): Payer: Self-pay | Admitting: Orthopedic Surgery

## 2013-02-27 NOTE — Op Note (Signed)
NAMECARNELIUS, Taylor Terry                  ACCOUNT NO.:  [id number]  MEDICAL RECORD NO.:  [id number]  LOCATION:  [facility]                         FACILITY:  [facility]  PHYSICIAN:  Artist Pais. Ebonique Hallstrom, M.D.DATE OF BIRTH:  11/29/1965  DATE OF PROCEDURE:  02/26/2013 DATE OF DISCHARGE:  02/26/2013                              OPERATIVE REPORT   PREOPERATIVE DIAGNOSIS:  Tip amputation, left small finger.  POSTOPERATIVE DIAGNOSIS:  Tip amputation, left small finger.  PROCEDURE:  Incision and drainage above with volar V-Y flap.  SURGEON:  Artist Pais. Mina Marble, M.D.  ASSISTANT:  None.  ANESTHESIA:  General.  COMPLICATION:  No complication.  DRAIN:  No drains.  DESCRIPTION OF PROCEDURE:  The patient was taken to the operating suite. After induction of general anesthesia, the left upper extremity was prepped and draped in sterile fashion.  An Esmarch was used to exsanguinate the limb.  Tourniquet was then inflated to 250 mmHg.  At this point in time, the left small finger was irrigated.  A skeletal shortening was taken with a rongeur.  What was left was basically the germinal matrix.  A small bit of bone was then removed to have an even intersection between bone and nail bed.  At this point in time, a volar V-Y flap was incised and lifted, advanced forward 1 cm, sutured distally with 4-0 Vicryl Rapide.  The patient had good turgor at the end of the procedure with the tourniquet released.  The patient was then placed in sterile dressing of Xeroform, 4x4s, and Coban wrap.  The patient tolerated the procedure and went to recovery room in stable fashion.     Artist Pais Mina Marble, M.D.     MAW/MEDQ  D:  02/26/2013  T:  02/27/2013  Job:  [id number]

## 2013-06-15 ENCOUNTER — Other Ambulatory Visit: Payer: Self-pay

## 2013-11-06 IMAGING — CR DG FINGER LITTLE 2+V*L*
3 series · 3 of 3 positions shown · non-contrast
Comparison: None.

CLINICAL DATA: Question injury to the fifth digit

LEFT LITTLE FINGER 2+V

[x finger pa left]
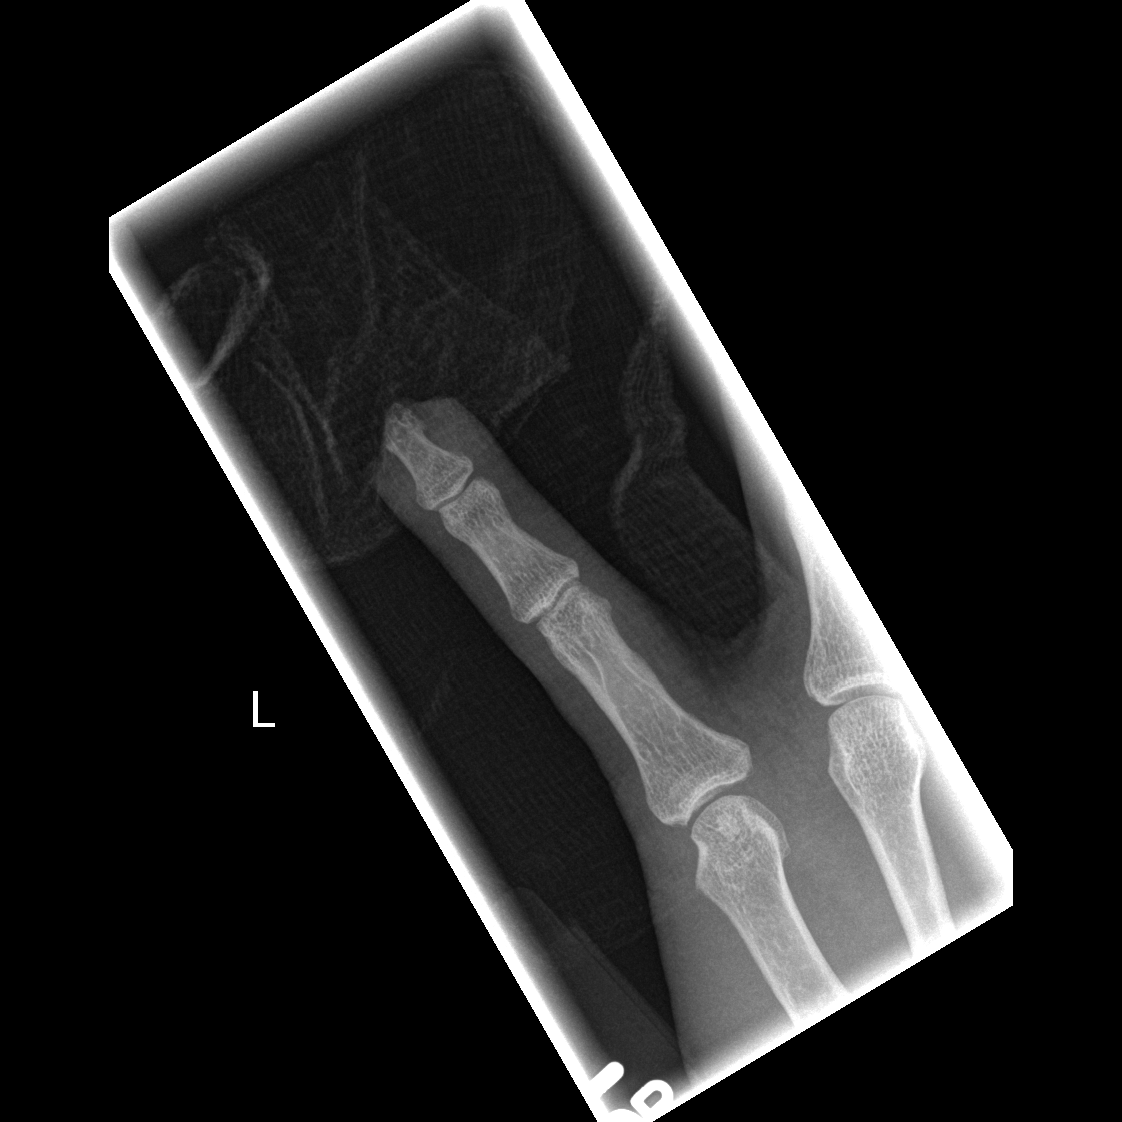

[x finger obl. left]
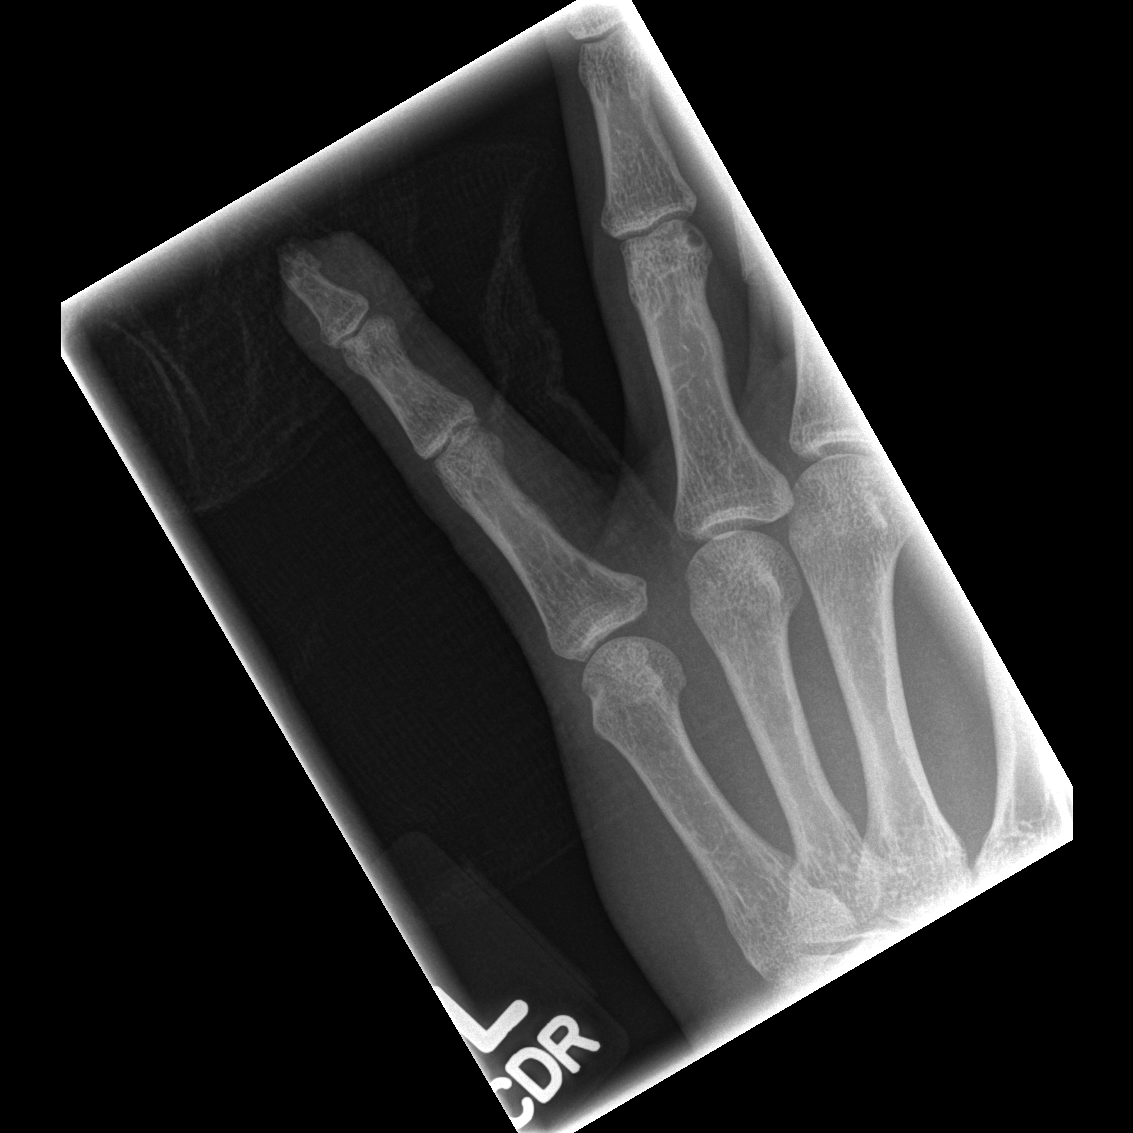

[x finger lateral left]
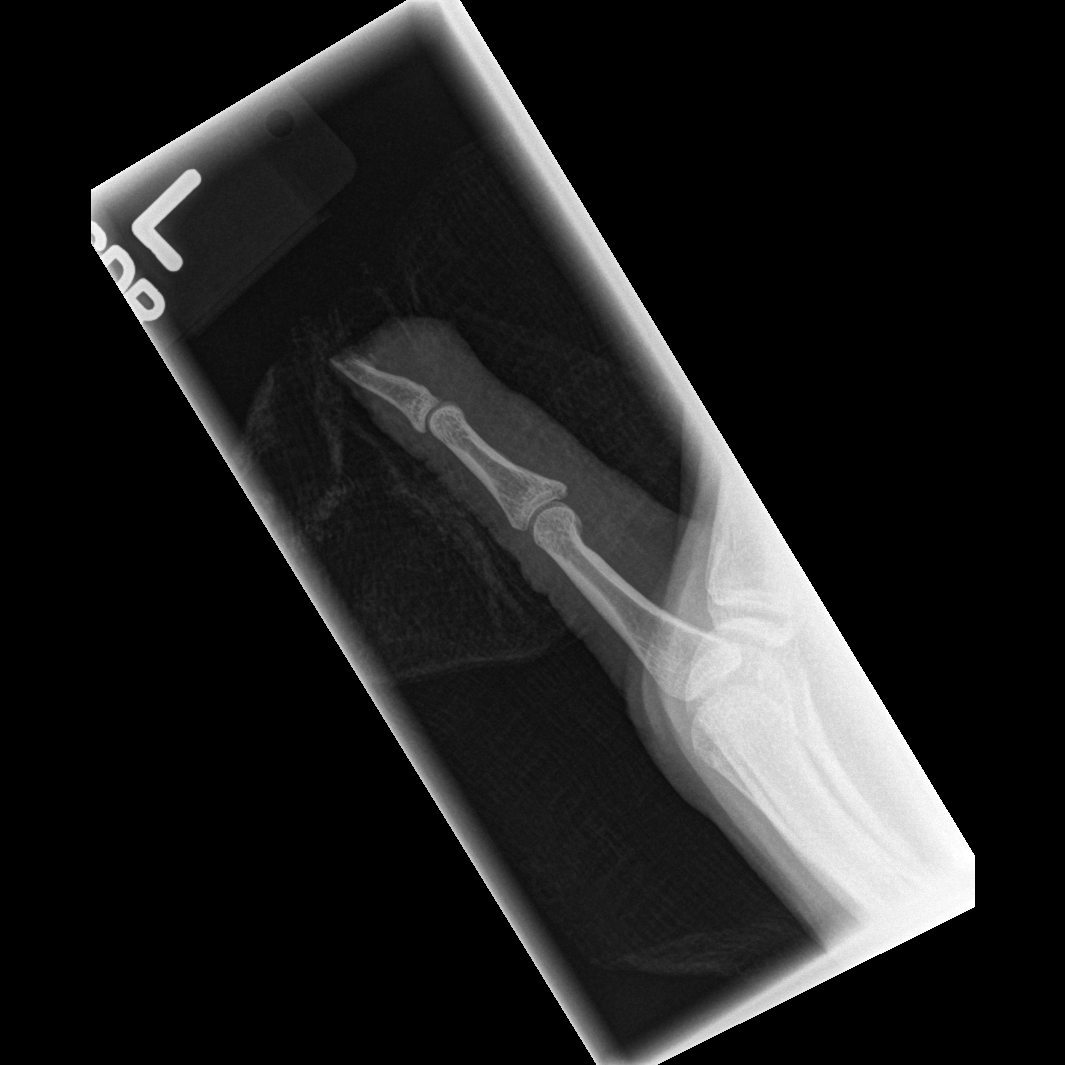

[3 of 3 positions shown; findings below may reference images not displayed]

FINDINGS: There is amputation of the bony tuft of the distal
phalanx of the fifth digit as well as the soft tissues of the tip
of the fifth digit.  No dislocation.
IMPRESSION: Amputation of the distal aspect of the distal phalanx of the fifth
digit.

## 2014-05-25 ENCOUNTER — Other Ambulatory Visit: Payer: Self-pay

## 2019-09-26 ENCOUNTER — Ambulatory Visit (AMBULATORY_SURGERY_CENTER): Payer: BC Managed Care – PPO | Admitting: *Deleted

## 2019-09-26 ENCOUNTER — Other Ambulatory Visit: Payer: Self-pay

## 2019-09-26 VITALS — Temp 97.4°F | Ht 71.0 in | Wt 189.6 lb

## 2019-09-26 DIAGNOSIS — Z01818 Encounter for other preprocedural examination: Secondary | ICD-10-CM

## 2019-09-26 DIAGNOSIS — Z1211 Encounter for screening for malignant neoplasm of colon: Secondary | ICD-10-CM

## 2019-09-26 NOTE — Progress Notes (Signed)
Son with pt- temperature 98- helping with translation.  Release of responsibility for interpretation form filled out  Pt is aware that care partner will wait in the car during procedure; if they feel like they will be too hot or cold to wait in the car; they may wait in the 4 th floor lobby. Patient is aware to bring only one care partner. We want them to wear a mask (we do not have any that we can provide them), practice social distancing, and we will check their temperatures when they get here.  I did remind the patient that their care partner needs to stay in the parking lot the entire time and have a cell phone available, we will call them when the pt is ready for discharge. Patient will wear mask into building.   No trouble with anesthesia, difficulty with intubation or hx/fam hx of malignant hypothermia  covid test 10-03-19 at 1:25 pm   No egg or soy allergy  No home oxygen use   No medications for weight loss taken  emmi information given

## 2019-10-03 ENCOUNTER — Ambulatory Visit (INDEPENDENT_AMBULATORY_CARE_PROVIDER_SITE_OTHER): Payer: BC Managed Care – PPO

## 2019-10-03 ENCOUNTER — Other Ambulatory Visit: Payer: Self-pay

## 2019-10-03 ENCOUNTER — Other Ambulatory Visit: Payer: Self-pay | Admitting: Gastroenterology

## 2019-10-03 DIAGNOSIS — Z1159 Encounter for screening for other viral diseases: Secondary | ICD-10-CM

## 2019-10-04 LAB — SARS CORONAVIRUS 2 (TAT 6-24 HRS): SARS Coronavirus 2: NEGATIVE

## 2019-10-05 ENCOUNTER — Encounter: Payer: Self-pay | Admitting: Gastroenterology

## 2019-10-06 ENCOUNTER — Encounter: Payer: Self-pay | Admitting: Gastroenterology

## 2019-10-06 ENCOUNTER — Ambulatory Visit (AMBULATORY_SURGERY_CENTER): Payer: BC Managed Care – PPO | Admitting: Gastroenterology

## 2019-10-06 ENCOUNTER — Other Ambulatory Visit: Payer: Self-pay

## 2019-10-06 VITALS — BP 101/77 | HR 55 | Temp 94.8°F | Resp 16 | Ht 71.0 in | Wt 189.0 lb

## 2019-10-06 DIAGNOSIS — Z1211 Encounter for screening for malignant neoplasm of colon: Secondary | ICD-10-CM

## 2019-10-06 DIAGNOSIS — K635 Polyp of colon: Secondary | ICD-10-CM | POA: Diagnosis not present

## 2019-10-06 DIAGNOSIS — D127 Benign neoplasm of rectosigmoid junction: Secondary | ICD-10-CM | POA: Diagnosis not present

## 2019-10-06 MED ORDER — SODIUM CHLORIDE 0.9 % IV SOLN: 500.0000 mL | Freq: Once | INTRAVENOUS | Status: DC

## 2019-10-06 NOTE — Progress Notes (Signed)
Pt's states no medical or surgical changes since previsit or office visit.  LC - temp KA - vitals 

## 2019-10-06 NOTE — Patient Instructions (Signed)
High-fiber diet. Use FiberCon 1 tablet by mouth daily.   Handouts provided on polyps, hemorrhoids and High-fiber diet.   USTED TUVO UN PROCEDIMIENTO ENDOSCPICO HOY EN EL [facility]:   Lea el informe del procedimiento que se le entreg para cualquier pregunta especfica sobre lo que se Primary school teacher.  Si el informe del examen no responde a sus preguntas, por favor llame a su gastroenterlogo para aclararlo.  Si usted solicit que no se le den [employer] de lo que se Estate manager/land agent en su procedimiento al [employer] va a cuidar, entonces el informe del procedimiento se ha incluido en un sobre sellado para que usted lo revise despus cuando le sea ms conveniente.   LO QUE PUEDE ESPERAR: Algunas sensaciones de hinchazn en el abdomen.  Puede tener ms gases de lo normal.  El caminar puede ayudarle a eliminar el aire que se le puso en el tracto gastrointestinal durante el procedimiento y reducir la hinchazn.  Si le hicieron una endoscopia inferior (como una colonoscopia o una sigmoidoscopia flexible), podra notar manchas de sangre en las heces fecales o en el papel higinico.  Si se someti a una preparacin intestinal para su procedimiento, es posible que no tenga una evacuacin intestinal normal durante [employer].   Tenga en cuenta:  Es posible que note un poco de irritacin y congestin en la nariz o algn drenaje.  Esto es debido al oxgeno [employer] durante su procedimiento.  No hay que preocuparse y esto debe desaparecer ms o Scientist, research (medical).   SNTOMAS PARA REPORTAR INMEDIATAMENTE:  Despus de una endoscopia inferior (colonoscopia o sigmoidoscopia flexible):  Cantidades excesivas de sangre en las heces fecales  Sensibilidad significativa o empeoramiento de los dolores abdominales   Hinchazn aguda del abdomen que antes no tena   Fiebre de 100F o ms     Para asuntos urgentes o de Freight forwarder, puede comunicarse con un gastroenterlogo a cualquier hora llamando al  [phone].  DIETA:  Recomendamos una comida pequea al principio, pero luego puede continuar con su dieta normal.  Tome muchos lquidos, Teacher, adult education las bebidas alcohlicas durante 24 horas.    ACTIVIDAD:  Debe planear tomarse las cosas con calma por el resto del da y no debe CONDUCIR ni usar maquinaria pesada Programmer, applications (debido a los medicamentos de sedacin utilizados durante el examen).     SEGUIMIENTO: Nuestro personal llamar al nmero que aparece en su historial al siguiente da hbil de su procedimiento para ver cmo se siente y para responder cualquier pregunta o inquietud que pueda tener con respecto a la informacin que se le dio despus del procedimiento. Si no podemos contactarle, le dejaremos un mensaje.  Sin embargo, si se siente bien y no tiene Paediatric nurse, no es necesario que nos devuelva la llamada.  Asumiremos que ha regresado a sus actividades diarias normales sin incidentes. Si se le tomaron algunas biopsias, le contactaremos por telfono o por carta en las prximas 3 semanas.  Si no ha sabido [employer] biopsias en el transcurso de 3 semanas, por favor llmenos al [phone].   FIRMAS/CONFIDENCIALIDAD: Usted y/o el acompaante que le cuide han firmado documentos que se ingresarn en su historial mdico electrnico.  Estas firmas atestiguan el hecho de que la informacin anterior

## 2019-10-06 NOTE — Progress Notes (Signed)
Called to room to assist during endoscopic procedure.  Patient ID and intended procedure confirmed with present staff. Received instructions for my participation in the procedure from the performing physician.  

## 2019-10-06 NOTE — Op Note (Signed)
[facility] Patient Name: Taylor Terry Procedure Date: 10/06/2019 2:53 PM MRN: [id number] Endoscopist: Justice Britain , MD Age: 54 Referring MD:  Date of Birth: 04-05-1966 Gender: Male Account #: [id number] Procedure:                Colonoscopy Indications:              Screening for colorectal malignant neoplasm, This                            is the patient's first colonoscopy Medicines:                Monitored Anesthesia Care Procedure:                Pre-Anesthesia Assessment:                           - Prior to the procedure, a History and Physical                            was performed, and patient medications and                            allergies were reviewed. The patient's tolerance of                            previous anesthesia was also reviewed. The risks                            and benefits of the procedure and the sedation                            options and risks were discussed with the patient.                            All questions were answered, and informed consent                            was obtained. Prior Anticoagulants: The patient has                            taken no previous anticoagulant or antiplatelet                            agents. ASA Grade Assessment: II - A patient with                            mild systemic disease. After reviewing the risks                            and benefits, the patient was deemed in                            satisfactory condition to undergo the procedure.  After obtaining informed consent, the colonoscope                            was passed under direct vision. Throughout the                            procedure, the patient's blood pressure, pulse, and                            oxygen saturations were monitored continuously. The                            Colonoscope was introduced through the anus and                            advanced to the 5 cm into  the ileum. The                            colonoscopy was performed without difficulty. The                            patient tolerated the procedure. The quality of the                            bowel preparation was good. The terminal ileum,                            ileocecal valve, appendiceal orifice, and rectum                            were photographed. Scope In: 3:13:55 PM Scope Out: 3:26:46 PM Scope Withdrawal Time: 0 hours 10 minutes 12 seconds  Total Procedure Duration: 0 hours 12 minutes 51 seconds  Findings:                 The digital rectal exam findings include                            hemorrhoids. Pertinent negatives include no                            palpable rectal lesions.                           The terminal ileum and ileocecal valve appeared                            normal.                           Two sessile polyps were found in the recto-sigmoid                            colon. The polyps were 2 to 3 mm in size. These  polyps were removed with a cold snare. Resection                            and retrieval were complete.                           Normal mucosa was found in the entire colon                            otherwise.                           Non-bleeding non-thrombosed internal hemorrhoids                            were found during retroflexion, during perianal                            exam and during digital exam. The hemorrhoids were                            Grade II (internal hemorrhoids that prolapse but                            reduce spontaneously). Complications:            No immediate complications. Estimated Blood Loss:     Estimated blood loss was minimal. Impression:               - Hemorrhoids found on digital rectal exam.                           - The examined portion of the ileum was normal.                           - Two 2 to 3 mm polyps at the recto-sigmoid colon,                             removed with a cold snare. Resected and retrieved.                           - Normal mucosa in the entire examined colon                            otherwise.                           - Non-bleeding non-thrombosed internal hemorrhoids. Recommendation:           - The patient will be observed post-procedure,                            until all discharge criteria are met.                           - Discharge patient to home.                           -  Patient has a contact number available for                            emergencies. The signs and symptoms of potential                            delayed complications were discussed with the                            patient. Return to normal activities tomorrow.                            Written discharge instructions were provided to the                            patient.                           - High fiber diet.                           - Use FiberCon 1 tablet PO daily.                           - Continue present medications.                           - Await pathology results.                           - Repeat colonoscopy 7/10 years for surveillance                            based on pathology results and adenomatous tissue.                           - The findings and recommendations were discussed                            with the patient.                           - The findings and recommendations were discussed                            with the patient's family. Justice Britain, MD 10/06/2019 3:32:32 PM

## 2019-10-06 NOTE — Progress Notes (Signed)
A/ox3, pleased with MAC, report to RN 

## 2019-10-10 ENCOUNTER — Telehealth: Payer: Self-pay

## 2019-10-10 ENCOUNTER — Telehealth: Payer: Self-pay | Admitting: *Deleted

## 2019-10-10 NOTE — Telephone Encounter (Signed)
Attempted f/u phone call. No answer. Left message. °

## 2019-10-10 NOTE — Telephone Encounter (Signed)
  Follow up Call-  Call back number 10/06/2019  Post procedure Call Back phone  # [phone]  Permission to leave phone message Yes  Some recent data might be hidden     Left message

## 2019-10-15 ENCOUNTER — Encounter: Payer: Self-pay | Admitting: Gastroenterology

## 2019-12-13 ENCOUNTER — Emergency Department (HOSPITAL_BASED_OUTPATIENT_CLINIC_OR_DEPARTMENT_OTHER)
Admission: EM | Admit: 2019-12-13 | Discharge: 2019-12-13 | Disposition: A | Discharge status: Home / Self Care | Payer: Worker's Compensation | Attending: Emergency Medicine | Admitting: Emergency Medicine

## 2019-12-13 ENCOUNTER — Encounter (HOSPITAL_BASED_OUTPATIENT_CLINIC_OR_DEPARTMENT_OTHER): Payer: Self-pay | Admitting: Emergency Medicine

## 2019-12-13 DIAGNOSIS — I10 Essential (primary) hypertension: Secondary | ICD-10-CM | POA: Diagnosis not present

## 2019-12-13 DIAGNOSIS — Z87891 Personal history of nicotine dependence: Secondary | ICD-10-CM | POA: Diagnosis not present

## 2019-12-13 DIAGNOSIS — Y9389 Activity, other specified: Secondary | ICD-10-CM | POA: Diagnosis not present

## 2019-12-13 DIAGNOSIS — Y9259 Other trade areas as the place of occurrence of the external cause: Secondary | ICD-10-CM | POA: Diagnosis not present

## 2019-12-13 DIAGNOSIS — Z79899 Other long term (current) drug therapy: Secondary | ICD-10-CM | POA: Insufficient documentation

## 2019-12-13 DIAGNOSIS — W268XXA Contact with other sharp object(s), not elsewhere classified, initial encounter: Secondary | ICD-10-CM | POA: Insufficient documentation

## 2019-12-13 DIAGNOSIS — Y990 Civilian activity done for income or pay: Secondary | ICD-10-CM | POA: Diagnosis not present

## 2019-12-13 DIAGNOSIS — S61512A Laceration without foreign body of left wrist, initial encounter: Secondary | ICD-10-CM | POA: Insufficient documentation

## 2019-12-13 MED ORDER — LIDOCAINE-EPINEPHRINE (PF) 2 %-1:200000 IJ SOLN
INTRAMUSCULAR | Status: AC
  Administered 2019-12-13: 10 mL
  Filled 2019-12-13: qty 10

## 2019-12-13 MED ORDER — LIDOCAINE-EPINEPHRINE 2 %-1:100000 IJ SOLN: 20.0000 mL | Freq: Once | INTRAMUSCULAR | Status: DC

## 2019-12-13 NOTE — ED Triage Notes (Signed)
Pt states he was at work and a metal sign fell on his left arm  Pt has a laceration noted to his left wrist

## 2019-12-13 NOTE — ED Provider Notes (Signed)
TIME SEEN: 6:38 AM  CHIEF COMPLAINT: Left wrist laceration  HPI: Patient is a 54 year old right-hand-dominant male with history of hypertension who presents to the emergency department with left wrist laceration that occurred while at work just prior to arrival.  [place] he was carrying a heavy aluminum thin metal logo with another Mudlogger.  States that the weight of the sign shifted towards him and it fell into his arm and cut his left wrist.  Last tetanus vaccination was 2 years ago.  No numbness or weakness.  No other injury.  ROS: See HPI Constitutional: no fever  Eyes: no drainage  ENT: no runny nose   Cardiovascular:  no chest pain  Resp: no SOB  GI: no vomiting GU: no dysuria Integumentary: no rash  Allergy: no hives  Musculoskeletal: no leg swelling  Neurological: no slurred speech ROS otherwise negative  PAST MEDICAL HISTORY/PAST SURGICAL HISTORY:  Past Medical History:  Diagnosis Date  . Allergy   . GERD (gastroesophageal reflux disease)   . Hypertension     MEDICATIONS:  Prior to Admission medications   Medication Sig Start Date End Date Taking? Authorizing Provider  Ascorbic Acid (VITAMIN C PO) Take by mouth daily.   Yes [provider]  losartan (COZAAR) 25 MG tablet daily.   Yes [provider]  losartan (COZAAR) 25 MG tablet Take 25 mg by mouth daily.   Yes [provider]  Multiple Vitamin (MULTIVITAMIN) capsule Take 1 capsule by mouth daily.   Yes [provider]  omeprazole (PRILOSEC OTC) 20 MG tablet Take 20 mg by mouth daily.   Yes [provider]  Glucosamine HCl (GLUCOSAMINE PO) Take by mouth daily.    [provider]  lisinopril (PRINIVIL,ZESTRIL) 10 MG tablet Take 10 mg by mouth every morning.  12/19/12   [provider]  loratadine (CLARITIN) 10 MG tablet Take 10 mg by mouth daily.    [provider]  oxyCODONE (OXY IR/ROXICODONE) 5 MG immediate release tablet Take 1-2 tablets (5-10  mg total) by mouth every 4 (four) hours as needed for pain. Patient not taking: Reported on 10/06/2019 01/26/13   Michael Boston, MD  oxyCODONE-acetaminophen (ROXICET) 5-325 MG per tablet Take 1 tablet by mouth every 4 (four) hours as needed for pain. Patient not taking: Reported on 10/06/2019 02/26/13   Charlotte Crumb, MD    ALLERGIES:  No Known Allergies  SOCIAL HISTORY:  Social History   Tobacco Use  . Smoking status: Former Smoker    Types: Cigarettes    Quit date: 01/24/1979    Years since quitting: 40.9  . Smokeless tobacco: Never Used  Substance Use Topics  . Alcohol use: Yes    Comment: 3 beers a day    FAMILY HISTORY: Family History  Problem Relation Age of Onset  . Colon cancer Neg Hx   . Esophageal cancer Neg Hx   . Rectal cancer Neg Hx   . Prostate cancer Neg Hx     EXAM: BP (!) 149/110 (BP Location: Right Arm)   Pulse 94   Temp 98.4 F (36.9 C) (Oral)   Resp 16   Ht 5\' 11"  (1.803 m)   Wt 86.2 kg   SpO2 98%   BMI 26.50 kg/m  CONSTITUTIONAL: Alert and responds appropriately to questions. Well-appearing; well-nourished HEAD: Normocephalic, atraumatic EYES: Conjunctivae clear, pupils appear equal ENT: normal nose; moist mucous membranes NECK: Normal range of motion CARD: Regular rate and rhythm RESP: Normal chest excursion without splinting or tachypnea; no  hypoxia or respiratory distress, speaking full sentences ABD/GI: non-distended EXT: Normal ROM in all joints, no major deformities noted, no bony tenderness noted to the left wrist, no tenderness in the left snuffbox, 2+ left radial pulse, normal flexion and extension of the wrist and fingers on the left side, compartments in the left forearm are soft, sensation to light touch intact diffusely, normal capillary refill SKIN: Normal color for age and race, no rashes on exposed skin, 4 cm superficial laceration without foreign body or tendon involvement to the left radial aspect of the distal  forearm/wrist NEURO: Moves all extremities equally, normal speech, no facial asymmetry noted PSYCH: The patient's mood and manner are appropriate. Grooming and personal hygiene are appropriate.  MEDICAL DECISION MAKING: Patient here with wrist laceration.  No bony tenderness.  Doubt fracture.  No retained foreign body or sign of tendon involvement.  Laceration superficial nature and did not involve bone.  Tetanus vaccination up-to-date.  Will repair wound at bedside.  Patient reports this is an injury that occurred while at work.    ED PROGRESS: Patient tolerated repair well.  Will discharge with wound care instructions and return precautions.  Will have sutures removed in 10 days.  Will provide with work note.  States he would like to be out of work at this time given he is a full-time job he is around dirt, sand and he also has a part-time job in a [employer].   LACERATION REPAIR Performed by: Pryor Curia Authorized by: Pryor Curia Consent: Verbal consent obtained. Risks and benefits: risks, benefits and alternatives were discussed Consent given by: patient Patient identity confirmed: provided demographic data Prepped and Draped in normal sterile fashion Wound explored  Laceration Location: left wrist  Laceration Length: 4 cm  No Foreign Bodies seen or palpated  Anesthesia: local infiltration  Local anesthetic: lidocaine 2% with epinephrine  Anesthetic total: 4 ml  Irrigation method: syringe Amount of cleaning: standard  Skin closure: simple  Number of sutures: 5  Technique: Area anesthetized using lidocaine 2% with lidocaine. Wound irrigated copiously with sterile saline. Wound then cleaned with Betadine and draped in sterile fashion. Wound closed using 5 simple interrupted sutures with 3-0 prolene.  Bacitracin and sterile dressing applied. Good wound approximation and hemostasis achieved.    Patient tolerance: Patient tolerated the procedure well with no  immediate complications.  Reik Ingerson was evaluated in Emergency Department on 12/13/2019 for the symptoms described in the history of present illness. He was evaluated in the context of the global COVID-19 pandemic, which necessitated consideration that the patient might be at risk for infection with the SARS-CoV-2 virus that causes COVID-19. Institutional protocols and algorithms that pertain to the evaluation of patients at risk for COVID-19 are in a state of rapid change based on information released by regulatory bodies including the CDC and federal and state organizations. These policies and algorithms were followed during the patient's care in the ED.      Oliver Heitzenrater, Delice Bison, DO 12/13/19 [phone]

## 2019-12-13 NOTE — Discharge Instructions (Addendum)
You may take Tylenol 1000 mg every 6 hours as needed for pain and alternate with ibuprofen 800 mg every 8 hours as needed for pain.  You may clean this wound gently with warm soap and water and apply Neosporin 1-2 times a day.  I recommend keeping a dressing in place until your wound is completely healed and the sutures have been removed.  Your sutures need to be removed in approximately 10 days.  This can be done in the emergency department or with your primary care provider.

## 2019-12-13 NOTE — ED Notes (Signed)
Provider in room suturing.

## 2020-11-04 ENCOUNTER — Emergency Department (HOSPITAL_BASED_OUTPATIENT_CLINIC_OR_DEPARTMENT_OTHER): Payer: Worker's Compensation

## 2020-11-04 ENCOUNTER — Emergency Department (HOSPITAL_BASED_OUTPATIENT_CLINIC_OR_DEPARTMENT_OTHER)
Admission: EM | Admit: 2020-11-04 | Discharge: 2020-11-04 | Disposition: A | Discharge status: Home / Self Care | Payer: Worker's Compensation | Attending: Emergency Medicine | Admitting: Emergency Medicine

## 2020-11-04 ENCOUNTER — Other Ambulatory Visit: Payer: Self-pay

## 2020-11-04 DIAGNOSIS — W312XXA Contact with powered woodworking and forming machines, initial encounter: Secondary | ICD-10-CM | POA: Diagnosis not present

## 2020-11-04 DIAGNOSIS — S61211A Laceration without foreign body of left index finger without damage to nail, initial encounter: Secondary | ICD-10-CM | POA: Diagnosis not present

## 2020-11-04 DIAGNOSIS — S61012A Laceration without foreign body of left thumb without damage to nail, initial encounter: Secondary | ICD-10-CM | POA: Diagnosis not present

## 2020-11-04 DIAGNOSIS — Z87891 Personal history of nicotine dependence: Secondary | ICD-10-CM | POA: Insufficient documentation

## 2020-11-04 DIAGNOSIS — R000 Tachycardia, unspecified: Secondary | ICD-10-CM | POA: Insufficient documentation

## 2020-11-04 DIAGNOSIS — Z79899 Other long term (current) drug therapy: Secondary | ICD-10-CM | POA: Insufficient documentation

## 2020-11-04 DIAGNOSIS — Y990 Civilian activity done for income or pay: Secondary | ICD-10-CM | POA: Diagnosis not present

## 2020-11-04 DIAGNOSIS — S6992XA Unspecified injury of left wrist, hand and finger(s), initial encounter: Secondary | ICD-10-CM | POA: Diagnosis present

## 2020-11-04 DIAGNOSIS — I10 Essential (primary) hypertension: Secondary | ICD-10-CM | POA: Diagnosis not present

## 2020-11-04 NOTE — ED Provider Notes (Signed)
[facility] Provider Note   CSN: [id number] Arrival date & time: 11/04/20  7741     History Chief Complaint  Patient presents with  . Laceration    f  . Finger Injury    Ronda Kazmi is a 55 y.o. male.  Patient is a 55 year old male with no significant past medical history presenting today after his hand got caught by the chop saw at work.  He was cutting small pieces of wood and reports the wood pulled in his hand went into the chop saw cutting his left thumb and index finger.  Tetanus shot is up-to-date.  Pain is manageable and only significant with palpating the injury.  Nail is intact in both hands.  Sensation is intact.  The history is provided by the patient.  Laceration Location:  Finger Finger laceration location:  L thumb and L index finger Depth:  Through dermis Quality: avulsion   Bleeding: controlled with pressure   Time since incident:  1 hour Laceration mechanism:  Metal edge (chop saw) Pain details:    Quality:  Aching   Severity:  Mild   Timing:  Constant   Progression:  Unchanged Foreign body present:  No foreign bodies Relieved by:  Pressure Worsened by:  Movement Ineffective treatments:  None tried Tetanus status:  Up to date Associated symptoms: no focal weakness and no numbness        Past Medical History:  Diagnosis Date  . Allergy   . GERD (gastroesophageal reflux disease)   . Hypertension     Patient Active Problem List   Diagnosis Date Noted  . Chronic cholecystitis with calculus 01/17/2013  . Hypertension     Past Surgical History:  Procedure Laterality Date  . 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 CHOLECYSTECTOMY N/A 01/26/2013   Procedure: LAPAROSCOPIC CHOLECYSTECTOMY WITH INTRAOPERATIVE CHOLANGIOGRAM WITH ONE PORT SINGLE SITE;  Surgeon: Adin Hector, MD;   Location: [facility];  Service: General;  Laterality: N/A;  . INGUINAL HERNIA REPAIR Bilateral 11/08/2008   [facility], Dr. Barry Dienes x2  . LEG SURGERY     left       Family History  Problem Relation Age of Onset  . Colon cancer Neg Hx   . Esophageal cancer Neg Hx   . Rectal cancer Neg Hx   . Prostate cancer Neg Hx     Social History   Tobacco Use  . Smoking status: Former Smoker    Types: Cigarettes    Quit date: 01/24/1979    Years since quitting: 41.8  . Smokeless tobacco: Never Used  Vaping Use  . Vaping Use: Never used  Substance Use Topics  . Alcohol use: Yes    Comment: 3 beers a day  . Drug use: No    Home Medications Prior to Admission medications   Medication Sig Start Date End Date Taking? Authorizing Provider  losartan (COZAAR) 25 MG tablet Take 25 mg by mouth daily.    [provider]  pantoprazole (PROTONIX) 40 MG tablet pantoprazole 40 mg tablet,delayed release    [provider]    Allergies    Patient has no known allergies.  Review of Systems   Review of Systems  Neurological: Negative for focal weakness.  All other systems reviewed and are negative.   Physical Exam Updated Vital Signs BP (!) 133/92 (BP Location: Right  Arm)   Pulse (!) 116   Temp 98.3 F (36.8 C) (Oral)   Resp 18   Ht 5\' 11"  (1.803 m)   Wt 86.2 kg   SpO2 98%   BMI 26.50 kg/m   Physical Exam Vitals and nursing note reviewed.  Constitutional:      General: He is not in acute distress.    Appearance: Normal appearance. He is normal weight.  HENT:     Head: Normocephalic.  Cardiovascular:     Rate and Rhythm: Tachycardia present.  Pulmonary:     Effort: Pulmonary effort is normal.  Musculoskeletal:        General: Signs of injury present.       Hands:     Comments: Bleeding present on the left index finger but controlled with pressure.  Skin:    General: Skin is warm and dry.  Neurological:     Mental Status: He is alert. Mental status is at baseline.   Psychiatric:        Mood and Affect: Mood normal.        Behavior: Behavior normal.     ED Results / Procedures / Treatments   Labs (all labs ordered are listed, but only abnormal results are displayed) Labs Reviewed - No data to display  EKG None  Radiology DG Finger Index Left  Result Date: 11/04/2020 CLINICAL DATA:  Saw injury to left index finger and thumb EXAM: LEFT INDEX FINGER 2+V COMPARISON:  Concurrently obtained radiographs of the left thumb FINDINGS: Soft tissue irregularity consistent with the clinical history of laceration. No evidence of acute fracture or radiopaque retained foreign body involving the distal phalanx of the index finger in the region of the soft tissue laceration. However, there is a faint curvilinear osseous body at the volar aspect of the base of the middle phalanx. If there was an associated hyper extension injury of the proximal interphalangeal joint, this could represent a small avulsion fracture. IMPRESSION: 1. Soft tissue laceration without evidence of osseous involvement at the distal phalanx and distal interphalangeal joint of the index finger. 2. Tiny curvilinear bony density at the volar aspect of the base of the middle phalanx. If there was an associated hyper extension injury at the proximal interphalangeal joint, this could represent a small avulsion fracture (volar plate injury). Electronically Signed   By: Jacqulynn Cadet M.D.   On: 11/04/2020 09:35   DG Finger Thumb Left  Result Date: 11/04/2020 CLINICAL DATA:  Saw injury to thumb and index finger EXAM: LEFT THUMB 2+V COMPARISON:  Concurrently obtained radiographs of the index finger FINDINGS: Soft tissue irregularity present about the distal thumb. Tiny linear density present at the dorsal aspect of the distal proximal phalanx. This appears circumscribed and without definitive donor site. IMPRESSION: 1. Soft tissue laceration about the distal thumb without clear evidence of fracture or  radiopaque retained foreign body. 2. A small linear bony fragment at the dorsal distal proximal phalanx appears to be well corticated and lacks a definitive donor site. This is favored to represent some ossification in the extensor ligament of the thumb interphalangeal joint. However, if there was an associated hyperflexion injury of the interphalangeal joint, a small avulsion fracture would be difficult to exclude in the setting of trauma. Electronically Signed   By: Jacqulynn Cadet M.D.   On: 11/04/2020 09:38    Procedures Procedures   Medications Ordered in ED Medications - No data to display  ED Course  I have reviewed the triage vital  signs and the nursing notes.  Pertinent labs & imaging results that were available during my care of the patient were reviewed by me and considered in my medical decision making (see chart for details).    MDM Rules/Calculators/A&P                          Patient presenting today after having his hand caught in the chop saw.  Laceration to the left thumb goes through the distal nail but should heal without intervention by secondary intention.  No indication for nail removal at this time.  X-ray of the thumb shows no acute fracture.  Patient's range of motion and sensation are intact.  Secondly he has a large soft tissue defect on the left index finger.  No bony exposure and no obvious fracture.  Will discuss with hand surgery given the size of the defect.  Tetanus shot is up-to-date.  11:10 AM Spoke with Dr. Lenon Curt who would like to see the patient in the office.  A nonadherent bandage was placed on the patient's finger and he will leave that in place until he sees Dr. Lenon Curt.  MDM Number of Diagnoses or Management Options   Amount and/or Complexity of Data Reviewed Tests in the radiology section of CPT: ordered and reviewed Independent visualization of images, tracings, or specimens: yes    Final Clinical Impression(s) / ED Diagnoses Final  diagnoses:  Laceration of left index finger without foreign body, nail damage status unspecified, initial encounter  Laceration of left thumb without foreign body without damage to nail, initial encounter    Rx / DC Orders ED Discharge Orders    None       Blanchie Dessert, MD 11/04/20 1111

## 2020-11-04 NOTE — Discharge Instructions (Addendum)
The hand surgeon would like to see you about the injury to your index finger.  Leave the bandage we place on until you see him.  The office should call you for a follow-up.

## 2020-11-04 NOTE — ED Notes (Signed)
Wound care   Done

## 2022-12-23 ENCOUNTER — Ambulatory Visit: Payer: Self-pay

## 2022-12-23 ENCOUNTER — Ambulatory Visit
Admission: EM | Admit: 2022-12-23 | Discharge: 2022-12-23 | Disposition: A | Discharge status: Home / Self Care | Payer: No Typology Code available for payment source

## 2022-12-23 DIAGNOSIS — R112 Nausea with vomiting, unspecified: Secondary | ICD-10-CM | POA: Diagnosis not present

## 2022-12-23 DIAGNOSIS — A084 Viral intestinal infection, unspecified: Secondary | ICD-10-CM | POA: Diagnosis not present

## 2022-12-23 DIAGNOSIS — R197 Diarrhea, unspecified: Secondary | ICD-10-CM

## 2022-12-23 MED ORDER — LOPERAMIDE HCL 2 MG PO CAPS: 2.0000 mg | ORAL_CAPSULE | Freq: Two times a day (BID) | ORAL | 0 refills | Status: AC | PRN

## 2022-12-23 MED ORDER — ONDANSETRON 4 MG PO TBDP
4.0000 mg | ORAL_TABLET | Freq: Once | ORAL | Status: AC
  Administered 2022-12-23: 4 mg via ORAL

## 2022-12-23 MED ORDER — ONDANSETRON 8 MG PO TBDP: 8.0000 mg | ORAL_TABLET | Freq: Three times a day (TID) | ORAL | 0 refills | Status: AC | PRN

## 2022-12-23 NOTE — Discharge Instructions (Signed)

## 2022-12-23 NOTE — ED Triage Notes (Signed)
Pt reports right upper quadrant pain, diarrhea, nausea 4 x  Reports he was vomiting on Saturday.

## 2022-12-23 NOTE — ED Provider Notes (Signed)
[facility]  Note:  This document was prepared using Conservation officer, historic buildings and may include unintentional dictation errors.  MRN: [id number] DOB: [birth year]  Subjective:   Taylor Terry is a 57 y.o. male presenting for 4-day history of persistent generalized belly pain over the epigastric area, right side, has associated nausea, vomiting and diarrhea.  Vomiting is improved but the diarrhea persists.  Has a history of a cholecystectomy in 2014.  No fever, bloody stools, recent antibiotic use, hospitalizations or long distance travel.  Has not eaten raw foods, drank unfiltered water.  No history of GI disorders including Crohn's, IBS, ulcerative colitis, diverticulitis.   No current facility-administered medications for this encounter.  Current Outpatient Medications:    amLODipine (NORVASC) 5 MG tablet, Take 5 mg by mouth daily., Disp: , Rfl:    losartan (COZAAR) 25 MG tablet, Take 25 mg by mouth daily., Disp: , Rfl:    pantoprazole (PROTONIX) 40 MG tablet, pantoprazole 40 mg tablet,delayed release, Disp: , Rfl:    No Known Allergies  Past Medical History:  Diagnosis Date   Allergy    GERD (gastroesophageal reflux disease)    Hypertension      Past Surgical History:  Procedure Laterality Date   AMPUTATION Left 02/26/2013   Procedure: revision of left small finger amputation;  Surgeon: Marlowe Shores, MD;  Location: [facility];  Service: Orthopedics;  Laterality: Left;   CHOLECYSTECTOMY N/A 01/26/2013   Procedure: LAPAROSCOPIC CHOLECYSTECTOMY WITH INTRAOPERATIVE CHOLANGIOGRAM WITH ONE PORT SINGLE SITE;  Surgeon: Ardeth Sportsman, MD;  Location: [facility];  Service: General;  Laterality: N/A;   INGUINAL HERNIA REPAIR Bilateral 11/08/2008   RI[facility], Dr. Donell Beers x2   LEG SURGERY     left    Family History  Problem Relation Age of Onset   Colon cancer Neg Hx    Esophageal cancer Neg Hx    Rectal cancer Neg Hx    Prostate cancer Neg Hx     Social History    Tobacco Use   Smoking status: Former    Types: Cigarettes    Quit date: 01/24/1979    Years since quitting: 43.9   Smokeless tobacco: Never  Vaping Use   Vaping Use: Never used  Substance Use Topics   Alcohol use: Yes    Comment: 3 beers a day   Drug use: Never    ROS   Objective:   Vitals: BP (!) 144/86 (BP Location: Right Arm)   Pulse 75   Temp 98.8 F (37.1 C) (Oral)   Resp 18   SpO2 98%   Physical Exam Constitutional:      General: He is not in acute distress.    Appearance: Normal appearance. He is well-developed and normal weight. He is not ill-appearing, toxic-appearing or diaphoretic.  HENT:     Head: Normocephalic and atraumatic.     Right Ear: External ear normal.     Left Ear: External ear normal.     Nose: Nose normal.     Mouth/Throat:     Mouth: Mucous membranes are moist.     Pharynx: Oropharynx is clear.  Eyes:     General: No scleral icterus.       Right eye: No discharge.        Left eye: No discharge.     Extraocular Movements: Extraocular movements intact.  Cardiovascular:     Rate and Rhythm: Normal rate and regular rhythm.     Heart sounds: Normal heart  sounds. No murmur heard.    No friction rub. No gallop.  Pulmonary:     Effort: Pulmonary effort is normal. No respiratory distress.     Breath sounds: Normal breath sounds. No stridor. No wheezing, rhonchi or rales.  Abdominal:     General: Bowel sounds are increased. There is no distension.     Palpations: Abdomen is soft. There is no mass.     Tenderness: There is generalized abdominal tenderness and tenderness in the right upper quadrant, right lower quadrant, epigastric area and periumbilical area. There is no right CVA tenderness, left CVA tenderness, guarding or rebound.  Musculoskeletal:     Cervical back: Normal range of motion.  Neurological:     Mental Status: He is alert and oriented to person, place, and time.  Psychiatric:        Mood and Affect: Mood normal.         Behavior: Behavior normal.        Thought Content: Thought content normal.        Judgment: Judgment normal.     Assessment and Plan :   PDMP not reviewed this encounter.  1. Viral gastroenteritis   2. Nausea vomiting and diarrhea    No signs of an acute abdomen. Will manage for suspected viral gastroenteritis with supportive care.  Recommended patient hydrate well, eat light meals and maintain electrolytes.  Will use Zofran and Imodium for nausea, vomiting and diarrhea. Counseled patient on potential for adverse effects with medications prescribed/recommended today, ER and return-to-clinic precautions discussed, patient verbalized understanding.    Wallis Bamberg, PA-C 12/23/22 1419

## 2023-09-15 ENCOUNTER — Ambulatory Visit
Admission: EM | Admit: 2023-09-15 | Discharge: 2023-09-15 | Disposition: A | Discharge status: Home / Self Care | Payer: No Typology Code available for payment source | Attending: Family Medicine | Admitting: Family Medicine

## 2023-09-15 DIAGNOSIS — J101 Influenza due to other identified influenza virus with other respiratory manifestations: Secondary | ICD-10-CM

## 2023-09-15 MED ORDER — OSELTAMIVIR PHOSPHATE 75 MG PO CAPS: 75.0000 mg | ORAL_CAPSULE | Freq: Two times a day (BID) | ORAL | 0 refills | Status: AC

## 2023-09-15 MED ORDER — PROMETHAZINE-DM 6.25-15 MG/5ML PO SYRP: 5.0000 mL | ORAL_SOLUTION | Freq: Three times a day (TID) | ORAL | 0 refills | Status: AC | PRN

## 2023-09-15 MED ORDER — ACETAMINOPHEN 325 MG PO TABS: 650.0000 mg | ORAL_TABLET | Freq: Four times a day (QID) | ORAL | 0 refills | Status: AC | PRN

## 2023-09-15 MED ORDER — BENZONATATE 100 MG PO CAPS: 100.0000 mg | ORAL_CAPSULE | Freq: Three times a day (TID) | ORAL | 0 refills | Status: AC | PRN

## 2023-09-15 MED ORDER — IBUPROFEN 600 MG PO TABS: 600.0000 mg | ORAL_TABLET | Freq: Four times a day (QID) | ORAL | 0 refills | Status: AC | PRN

## 2023-09-15 NOTE — ED Provider Notes (Signed)
 [facility]  Note:  This document was prepared using Conservation officer, historic buildings and may include unintentional dictation errors.  MRN: [id number] DOB: 10/21/1965  Subjective:   Taylor Terry is a 58 y.o. male presenting for 3-day history of acute onset malaise, body pains, chills, headaches, sinus congestion, throat pain, painful swallowing, coughing.  Had exposure to multiple family members at home that tested positive for influenza A.  No asthma.  No current facility-administered medications for this encounter.  Current Outpatient Medications:    omeprazole (PRILOSEC) 20 MG capsule, Take by mouth., Disp: , Rfl:    amLODipine (NORVASC) 5 MG tablet, Take 5 mg by mouth daily., Disp: , Rfl:    loperamide  (IMODIUM ) 2 MG capsule, Take 1 capsule (2 mg total) by mouth 2 (two) times daily as needed for diarrhea or loose stools., Disp: 14 capsule, Rfl: 0   losartan (COZAAR) 25 MG tablet, Take 25 mg by mouth daily., Disp: , Rfl:    ondansetron  (ZOFRAN -ODT) 8 MG disintegrating tablet, Take 1 tablet (8 mg total) by mouth every 8 (eight) hours as needed for nausea or vomiting., Disp: 20 tablet, Rfl: 0   pantoprazole (PROTONIX) 40 MG tablet, pantoprazole 40 mg tablet,delayed release, Disp: , Rfl:    No Known Allergies  Past Medical History:  Diagnosis Date   Allergy    GERD (gastroesophageal reflux disease)    Hypertension      Past Surgical History:  Procedure Laterality Date   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 Aron x2   LEG SURGERY     left     Family History  Problem Relation Age of Onset   Colon cancer Neg Hx    Esophageal cancer Neg Hx    Rectal cancer Neg Hx    Prostate cancer Neg Hx     Social History   Tobacco Use   Smoking status: Former    Current packs/day: 0.00    Types: Cigarettes    Quit date: 01/24/1979    Years since quitting: 44.6   Smokeless tobacco: Never  Vaping Use   Vaping status: Never Used  Substance Use Topics   Alcohol use: Yes    Comment: 3 beers a day   Drug use: Never    ROS   Objective:   Vitals: BP (!) 154/108 (BP Location: Right Arm)   Pulse 94   Temp 98.8 F (37.1 C) (Oral)   Resp 18   SpO2 96%   Physical Exam Constitutional:      General: He is not in acute distress.    Appearance: Normal appearance. He is well-developed and normal weight. He is not ill-appearing, toxic-appearing or diaphoretic.  HENT:     Head: Normocephalic and atraumatic.     Right Ear: Tympanic membrane, ear canal and external ear normal. No drainage, swelling or tenderness. No middle ear effusion. There is no impacted cerumen. Tympanic membrane is not erythematous or bulging.     Left Ear: Tympanic membrane, ear canal and external ear normal. No drainage, swelling or tenderness.  No middle ear effusion. There is no impacted  cerumen. Tympanic membrane is not erythematous or bulging.     Nose: Congestion and rhinorrhea present.     Mouth/Throat:     Mouth: Mucous membranes are moist.     Pharynx: No pharyngeal swelling, oropharyngeal exudate, posterior oropharyngeal erythema or uvula swelling.     Tonsils: No tonsillar exudate or tonsillar abscesses. 0 on the right. 0 on the left.  Eyes:     General: No scleral icterus.       Right eye: No discharge.        Left eye: No discharge.     Extraocular Movements: Extraocular movements intact.     Conjunctiva/sclera: Conjunctivae normal.  Cardiovascular:     Rate and Rhythm: Normal rate and regular rhythm.     Heart sounds: Normal heart sounds. No  murmur heard.    No friction rub. No gallop.  Pulmonary:     Effort: Pulmonary effort is normal. No respiratory distress.     Breath sounds: Normal breath sounds. No stridor. No wheezing, rhonchi or rales.  Musculoskeletal:     Cervical back: Normal range of motion and neck supple. No rigidity. No muscular tenderness.  Neurological:     General: No focal deficit present.     Mental Status: He is alert and oriented to person, place, and time.  Psychiatric:        Mood and Affect: Mood normal.        Behavior: Behavior normal.        Thought Content: Thought content normal.        Judgment: Judgment normal.     Assessment and Plan :   PDMP not reviewed this encounter.  1. Influenza A     Patient has no documented exposure to influenza A.  Will cover for influenza with Tamiflu  given exposure, symptom set, current incidence in the community.  Use supportive care, rest, fluids, hydration, light meals, schedule Tylenol  and ibuprofen . Counseled patient on potential for adverse effects with medications prescribed today, patient verbalized understanding. ER and return-to-clinic precautions discussed, patient verbalized understanding.    Christopher Savannah, PA-C 09/15/23 1344

## 2023-09-15 NOTE — ED Triage Notes (Signed)
 Pt reports cough, chills, body aches, nasal congestion, throat feels closing on and off due to congestion and fever x 3 days. Taking ibuprofen .

## 2023-09-15 NOTE — Discharge Instructions (Addendum)
Para el dolor de garganta o tos puede usar un t de miel. Use 3 cucharaditas de miel con jugo exprimido de [employer]. Coloque trozos de Pension scheme manager en 1/2-1 taza de agua y caliente sobre la estufa. Luego mezcle los ingredientes y repita cada 4 horas. Para fiebre, dolores de cuerpo tome ibuprofeno '600mg'$  con comida cada 6 horas alternando con o junto con Tylenol '500mg'$ -'650mg'$  cada 6 horas. Hidrata muy bien con al menos 2 litros (64 onzas) de agua al dia. Coma comidas ligeras como sopas para [employer] y nutricion. Tambien puede tomar suero. Comience un antihistamnico como Zyrtec (cetirizina) '10mg'$  al dia. Puede usar pseudoefedrina (Sudafed) de venta libre para el goteo posnasal, congestin a una dosis de 60 mg cada 8 horas o cada 12 horas. Use el jarabe por la noche para su tos y las capsulas durante el dia.
